# Patient Record
Sex: Female | Born: 1940
Health system: Southern US, Community
[De-identification: ages and names within clinical notes are randomized; demographics above are authoritative.]

---

## 2017-01-26 ENCOUNTER — Observation Stay (HOSPITAL_COMMUNITY)
Admission: EM | Admit: 2017-01-26 | Discharge: 2017-01-27 | Disposition: A | Discharge status: Home / Self Care | Payer: Medicare Other | Attending: Internal Medicine | Admitting: Internal Medicine

## 2017-01-26 ENCOUNTER — Encounter (HOSPITAL_COMMUNITY): Payer: Self-pay | Admitting: *Deleted

## 2017-01-26 ENCOUNTER — Emergency Department (HOSPITAL_COMMUNITY): Payer: Medicare Other

## 2017-01-26 DIAGNOSIS — I2699 Other pulmonary embolism without acute cor pulmonale: Secondary | ICD-10-CM | POA: Diagnosis not present

## 2017-01-26 DIAGNOSIS — Z87891 Personal history of nicotine dependence: Secondary | ICD-10-CM | POA: Diagnosis not present

## 2017-01-26 DIAGNOSIS — R310 Gross hematuria: Secondary | ICD-10-CM | POA: Insufficient documentation

## 2017-01-26 DIAGNOSIS — R319 Hematuria, unspecified: Secondary | ICD-10-CM

## 2017-01-26 DIAGNOSIS — R0602 Shortness of breath: Secondary | ICD-10-CM | POA: Diagnosis not present

## 2017-01-26 LAB — BASIC METABOLIC PANEL
ANION GAP: 13 (ref 5–15)
BUN: 20 mg/dL (ref 6–20)
CHLORIDE: 101 mmol/L (ref 101–111)
CO2: 25 mmol/L (ref 22–32)
Calcium: 9.4 mg/dL (ref 8.9–10.3)
Creatinine, Ser: 1.07 mg/dL — ABNORMAL HIGH (ref 0.44–1.00)
GFR calc non Af Amer: 49 mL/min — ABNORMAL LOW (ref >60)
GFR, EST AFRICAN AMERICAN: 57 mL/min — AB (ref >60)
GLUCOSE: 195 mg/dL — AB (ref 65–99)
Potassium: 4.3 mmol/L (ref 3.5–5.1)
Sodium: 139 mmol/L (ref 135–145)

## 2017-01-26 LAB — CBC
HEMATOCRIT: 43.3 % (ref 36.0–46.0)
HEMOGLOBIN: 14.2 g/dL (ref 12.0–15.0)
MCH: 30.7 pg (ref 26.0–34.0)
MCHC: 32.8 g/dL (ref 30.0–36.0)
MCV: 93.5 fL (ref 78.0–100.0)
Platelets: 212 10*3/uL (ref 150–400)
RBC: 4.63 MIL/uL (ref 3.87–5.11)
RDW: 13.7 % (ref 11.5–15.5)
WBC: 12.4 10*3/uL — ABNORMAL HIGH (ref 4.0–10.5)

## 2017-01-26 LAB — BRAIN NATRIURETIC PEPTIDE: B Natriuretic Peptide: 1159.8 pg/mL — ABNORMAL HIGH (ref 0.0–100.0)

## 2017-01-26 MED ORDER — HEPARIN (PORCINE) IN NACL 100-0.45 UNIT/ML-% IJ SOLN
1400.0000 [IU]/h | INTRAMUSCULAR | Status: DC
  Administered 2017-01-26: 1400 [IU]/h via INTRAVENOUS
  Filled 2017-01-26: qty 250

## 2017-01-26 MED ORDER — PROMETHAZINE HCL 25 MG PO TABS: 12.5000 mg | ORAL_TABLET | Freq: Four times a day (QID) | ORAL | Status: DC | PRN

## 2017-01-26 MED ORDER — SODIUM CHLORIDE 0.9% FLUSH
3.0000 mL | Freq: Two times a day (BID) | INTRAVENOUS | Status: DC
  Administered 2017-01-26: 3 mL via INTRAVENOUS

## 2017-01-26 MED ORDER — ACETAMINOPHEN 650 MG RE SUPP: 650.0000 mg | Freq: Four times a day (QID) | RECTAL | Status: DC | PRN

## 2017-01-26 MED ORDER — RIVAROXABAN 15 MG PO TABS
15.0000 mg | ORAL_TABLET | Freq: Two times a day (BID) | ORAL | Status: DC
  Administered 2017-01-26 – 2017-01-27 (×2): 15 mg via ORAL
  Filled 2017-01-26 (×2): qty 1

## 2017-01-26 MED ORDER — IOPAMIDOL (ISOVUE-370) INJECTION 76%
INTRAVENOUS | Status: AC
  Administered 2017-01-26: 100 mL
  Filled 2017-01-26: qty 100

## 2017-01-26 MED ORDER — SENNOSIDES-DOCUSATE SODIUM 8.6-50 MG PO TABS: 1.0000 | ORAL_TABLET | Freq: Every evening | ORAL | Status: DC | PRN

## 2017-01-26 MED ORDER — HEPARIN BOLUS VIA INFUSION
5000.0000 [IU] | Freq: Once | INTRAVENOUS | Status: AC
  Administered 2017-01-26: 5000 [IU] via INTRAVENOUS
  Filled 2017-01-26: qty 5000

## 2017-01-26 MED ORDER — ACETAMINOPHEN 325 MG PO TABS: 650.0000 mg | ORAL_TABLET | Freq: Four times a day (QID) | ORAL | Status: DC | PRN

## 2017-01-26 NOTE — ED Triage Notes (Signed)
Pt states woke up Sat at 4 am with sob and chest pressure.  States chest pressure/heart burn has gone away,  But still feels very sob with any activity.  Feels like zantac helped.

## 2017-01-26 NOTE — H&P (Signed)
Date: 01/26/2017               Patient Name:  Regina Bell MRN: [id number]  DOB: Mar 02, 1941 Age / Sex: 76 y.o., female   PCP: No Pcp Per Patient         Medical Service: Internal Medicine Teaching Service         Attending Physician: Dr. Geoffery Lyons, MD    First Contact: Dr. Carolynn Comment Pager: [phone]  Second Contact: Dr. Deneise Lever Pager: [phone]       After Hours (After 5p /  First Contact Pager: [phone]  Weekends / Holidays): Second Contact Pager: [phone]   Chief Complaint: SOB  History of Present Illness: Regina Bell is a 76 y.o. female without PMH and no medications who presents with acute onset of SOB.  Pt reports that SOB began 1 wk ago and has been progressively worsening. It is associated with activity and improves with rest. She did have some heart burn last Saturday which began at the same time as her SOB, but this resolved with Zantac and she denies any further CP or pressure. Denies cough, fever, chills, leg swelling, recent travel, recent surgery. Does have remote smoking hx.  In the ED, pt was mildly hypertensive to 160s/80s (but this was measured with wrist BP cuff), tachypneic, but not tachycardic. EKG was wnl. She had CXR suggestive of mild vascular congestion and was found to have elevated BNP to 1159. She had CTA chest for concern of PE and was found to have bilateral PEs w/o R heart strain. She was started on heparin bolus 5000U.  Meds: Current Facility-Administered Medications  Medication Dose Route Frequency Provider Last Rate Last Dose  . heparin ADULT infusion 100 units/mL (25000 units/241mL sodium chloride 0.45%)  1,400 Units/hr Intravenous Continuous Sherron Monday, RPH 14 mL/hr at 01/26/17 1517 1,400 Units/hr at 01/26/17 1517   No current outpatient prescriptions on file.   Allergies: Allergies as of 01/26/2017  . (No Known Allergies)   History reviewed. No pertinent past medical history. Family History: Pt family history is  not on file.  Social History: Pt  reports that she has quit smoking. She has never used smokeless tobacco. She reports that she does not drink alcohol or use drugs.  Review of Systems: A complete ROS was negative except as per HPI. Review of Systems  Constitutional: Negative for chills, fever and weight loss.  Eyes: Negative for blurred vision.  Respiratory: Positive for shortness of breath. Negative for cough.   Cardiovascular: Negative for chest pain and leg swelling.  Gastrointestinal: Negative for abdominal pain, constipation, diarrhea, nausea and vomiting.  Genitourinary: Negative for dysuria, frequency and urgency.  Musculoskeletal: Negative for myalgias.  Skin: Negative for rash.  Neurological: Negative for dizziness, tremors and headaches.  Endo/Heme/Allergies: Negative for polydipsia.  Psychiatric/Behavioral: The patient is not nervous/anxious.    Physical Exam: Vitals:   01/26/17 1145 01/26/17 1430 01/26/17 1500 01/26/17 1515  BP: (!) 166/78 (!) 160/89 (!) 149/51 (!) 150/72  Pulse: 87 92 85 83  Resp: (!) 24 (!) 28 (!) 27 (!) 26  Temp:      TempSrc:      SpO2: 94% 92% 96% 97%  Weight:      Height:       Physical Exam  Constitutional: She is oriented to person, place, and time. She appears well-developed. She is cooperative. No distress.  HENT:  Head: Normocephalic and atraumatic.  Right Ear: Hearing normal.  Left Ear:  Hearing normal.  Nose: Nose normal.  Mouth/Throat: Mucous membranes are normal.  Cardiovascular: Normal rate, regular rhythm, S1 normal, S2 normal and intact distal pulses.  Exam reveals no gallop.   No murmur heard. Pulmonary/Chest: Effort normal. No respiratory distress. She has no wheezes. She has no rhonchi. She has rales (few bibasilar crackles). She exhibits no tenderness. Breasts are symmetrical.  Abdominal: Soft. Normal appearance and bowel sounds are normal. She exhibits no ascites. There is no hepatosplenomegaly. There is no tenderness.  There is no CVA tenderness.  Musculoskeletal: Normal range of motion. She exhibits no edema.  Neurological: She is alert and oriented to person, place, and time. She has normal strength.  Skin: Skin is warm, dry and intact. She is not diaphoretic.  Psychiatric: She has a normal mood and affect. Her speech is normal and behavior is normal.   Labs: CBC:  Recent Labs Lab 01/26/17 1016  WBC 12.4*  HGB 14.2  HCT 43.3  MCV 93.5  PLT 212   Basic Metabolic Panel:  Recent Labs Lab 01/26/17 1016  NA 139  K 4.3  CL 101  CO2 25  GLUCOSE 195*  BUN 20  CREATININE 1.07*  CALCIUM 9.4   BNP (last 3 results)  Recent Labs  01/26/17 1229  BNP 1,159.8*   Imaging: EKG Interpretation   Dg Chest 2 View Result Date: 01/26/2017 CLINICAL DATA:  Severe shortness of Breath EXAM: CHEST  2 VIEW COMPARISON:  None. FINDINGS: Cardiac shadow is enlarged. Aortic calcifications are noted. Mild vascular congestion is seen without interstitial edema. Mild bibasilar atelectatic changes are noted. Degenerative change of the thoracic spine is seen. IMPRESSION: Mild vascular congestion without interstitial edema. Electronically Signed   By: Alcide Clever M.D.   On: 01/26/2017 10:38   Ct Angio Chest Pe W Or Wo Contrast Result Date: 01/26/2017 CLINICAL DATA:  Shortness of breath for 1 week EXAM: CT ANGIOGRAPHY CHEST WITH CONTRAST TECHNIQUE: Multidetector CT imaging of the chest was performed using the standard protocol during bolus administration of intravenous contrast. Multiplanar CT image reconstructions and MIPs were obtained to evaluate the vascular anatomy. CONTRAST:  100 mL Isovue 370. COMPARISON:  None. FINDINGS: Cardiovascular: Thoracic aorta shows a normal branching pattern. Diffuse atherosclerotic calcifications are noted without aneurysmal dilatation. The pulmonary artery is well visualized and demonstrates scattered filling defects throughout both lungs consistent with emboli. No evidence of right  heart strain is noted at this time. Mediastinum/Nodes: No hilar or mediastinal adenopathy is noted. The thoracic inlet is within normal limits. Lungs/Pleura: Bilateral large pleural effusions are noted. Mild bibasilar atelectatic changes are seen. Upper Abdomen: No acute abnormality noted. Musculoskeletal: Degenerative changes of the thoracic spine are seen. Review of the MIP images confirms the above findings. IMPRESSION: Bilateral pulmonary emboli without right heart strain No other focal abnormality is noted. Critical Value/emergent results were called by telephone at the time of interpretation on 01/26/2017 at 1:17 pm to Dr. Geoffery Lyons , who verbally acknowledged these results. Electronically Signed   By: Alcide Clever M.D.   On: 01/26/2017 13:18   Assessment & Plan by Problem: Active Problems:   Pulmonary embolism [facility])  Regina Bell is a 77 y.o. female w/o PMH who presents with SOB 2/2 bilateral PEs.  1) PE: HDS w/o evidence of R heart strain radiographically. s/p heparin bolus. Will transition to Xarelto. - admit to tele for obs - start Xarelto BID - repeat CXR in AM  2) Elevated BP: pt presents with elevated BP in  the setting of acute PE. No h/o HTN. Will watch presently.  DVT PPx - Xarelto  Code Status - Full  Dispo: Admit patient to Observation with expected length of stay less than 2 midnights.  Signed: Carolynn Comment, MD 01/26/2017, 3:41 PM  Pager: [phone]

## 2017-01-26 NOTE — ED Notes (Signed)
Patient transported to CT 

## 2017-01-26 NOTE — Progress Notes (Addendum)
ANTICOAGULATION CONSULT NOTE - Initial Consult  Addendum: Transition to Xarelto  BID for first 21 days, then  daily after. Stop heparin at time of first Xarelto dose.   Pharmacy Consult for heparin Indication: pulmonary embolus  No Known Allergies  Patient Measurements: Height:  (165.1 cm) Weight: 229 lb (103.9 kg) IBW/kg (Calculated) : 57 Heparin Dosing Weight: 81 kg  Vital Signs: Temp: 97.9 F (36.6 C) (04/20 1009) Temp Source: Oral (04/20 1009) BP: 160/89 (04/20 1430) Pulse Rate: 92 (04/20 1430)  Labs:  Recent Labs  01/26/17 1016  HGB 14.2  HCT 43.3  PLT 212  CREATININE 1.07*    Estimated Creatinine Clearance: 54.4 mL/min (A) (by C-G formula based on SCr of 1.07 mg/dL (H)).   Medical History: History reviewed. No pertinent past medical history.  Assessment: 76 yo female with bilateral PE w/o right heart strain. Pharmacy consulted to start heparin. CBC wnl.  Goal of Therapy:  Heparin level 0.3-0.7 units/ml Monitor platelets by anticoagulation protocol: Yes   Plan:  Give 5000 units bolus x 1 Start heparin infusion at 1400 units/hr Check anti-Xa level in 8 hours and daily while on heparin Continue to monitor H&H and platelets  Sherron Monday 01/26/2017,3:25 PM

## 2017-01-26 NOTE — Discharge Instructions (Signed)
Information on my medicine - XARELTO (rivaroxaban)  This medication education was reviewed with me or my healthcare representative as part of my discharge preparation.  The pharmacist that spoke with me during my hospital stay was:  Sherron Monday, [facility]  WHY WAS XARELTO PRESCRIBED FOR YOU? Xarelto was prescribed to treat blood clots that may have been found in the veins of your legs (deep vein thrombosis) or in your lungs (pulmonary embolism) and to reduce the risk of them occurring again.  What do you need to know about Xarelto? The starting dose is one 15 mg tablet taken TWICE daily with food for the FIRST 21 DAYS then on 02/16/17  the dose is changed to one 20 mg tablet taken ONCE A DAY with your evening meal.  DO NOT stop taking Xarelto without talking to the health care provider who prescribed the medication.  Refill your prescription for 20 mg tablets before you run out.  After discharge, you should have regular check-up appointments with your healthcare provider that is prescribing your Xarelto.  In the future your dose may need to be changed if your kidney function changes by a significant amount.  What do you do if you miss a dose? If you are taking Xarelto TWICE DAILY and you miss a dose, take it as soon as you remember. You may take two 15 mg tablets (total 30 mg) at the same time then resume your regularly scheduled 15 mg twice daily the next day.  If you are taking Xarelto ONCE DAILY and you miss a dose, take it as soon as you remember on the same day then continue your regularly scheduled once daily regimen the next day. Do not take two doses of Xarelto at the same time.   Important Safety Information Xarelto is a blood thinner medicine that can cause bleeding. You should call your healthcare provider right away if you experience any of the following: ? Bleeding from an injury or your nose that does not stop. ? Unusual colored urine (red or dark brown) or unusual colored  stools (red or black). ? Unusual bruising for unknown reasons. ? A serious fall or if you hit your head (even if there is no bleeding).  Some medicines may interact with Xarelto and might increase your risk of bleeding while on Xarelto. To help avoid this, consult your healthcare provider or pharmacist prior to using any new prescription or non-prescription medications, including herbals, vitamins, non-steroidal anti-inflammatory drugs (NSAIDs) and supplements.  This website has more information on Xarelto: VisitDestination.com.br.

## 2017-01-26 NOTE — ED Provider Notes (Signed)
[facility]-EMERGENCY DEPT Provider Note   CSN: [id number] Arrival date & time: 01/26/17  4098     History   Chief Complaint Chief Complaint  Patient presents with  . Shortness of Breath    HPI Regina Bell is a 76 y.o. female.  Patient is a 76 year old female with no prior past medical history who takes no medications. She presents today for evaluation of shortness of breath. This has been present for one week and is worsening. She has no symptoms at rest, however when she stands and ambulates becomes dyspneic. She denies any chest pain, fevers, chills, or productive cough. She denies any leg swelling. She denies any recent travel.   The history is provided by the patient.  Shortness of Breath  This is a new problem. The average episode lasts 1 week. The problem occurs continuously.The problem has been gradually worsening. Pertinent negatives include no fever, no cough, no sputum production, no chest pain, no leg pain and no leg swelling. She has tried nothing for the symptoms.    History reviewed. No pertinent past medical history.  There are no active problems to display for this patient.   History reviewed. No pertinent surgical history.  OB History    No data available       Home Medications    Prior to Admission medications   Not on File    Family History No family history on file.  Social History Social History  Substance Use Topics  . Smoking status: Former Games developer  . Smokeless tobacco: Never Used  . Alcohol use No     Allergies   Patient has no known allergies.   Review of Systems Review of Systems  Constitutional: Negative for fever.  Respiratory: Positive for shortness of breath. Negative for cough and sputum production.   Cardiovascular: Negative for chest pain and leg swelling.  All other systems reviewed and are negative.    Physical Exam Updated Vital Signs BP (!) 177/89   Pulse 91   Temp 97.9 F (36.6 C) (Oral)   Resp (!) 30    Ht  (1.651 m)   Wt 229 lb (103.9 kg)   SpO2 94%   BMI 38.11 kg/m   Physical Exam  Constitutional: She is oriented to person, place, and time. She appears well-developed and well-nourished. No distress.  HENT:  Head: Normocephalic and atraumatic.  Mouth/Throat: Oropharynx is clear and moist.  Neck: Normal range of motion. Neck supple.  Cardiovascular: Normal rate and regular rhythm.  Exam reveals no gallop and no friction rub.   No murmur heard. Pulmonary/Chest: Effort normal and breath sounds normal. No respiratory distress. She has no wheezes.  Abdominal: Soft. Bowel sounds are normal. She exhibits no distension. There is no tenderness.  Musculoskeletal: Normal range of motion. She exhibits no edema.  Neurological: She is alert and oriented to person, place, and time.  Skin: Skin is warm and dry. She is not diaphoretic.  Nursing note and vitals reviewed.    ED Treatments / Results  Labs (all labs ordered are listed, but only abnormal results are displayed) Labs Reviewed  BASIC METABOLIC PANEL - Abnormal; Notable for the following:       Result Value   Glucose, Bld 195 (*)    Creatinine, Ser 1.07 (*)    GFR calc non Af Amer 49 (*)    GFR calc Af Amer 57 (*)    All other components within normal limits  CBC - Abnormal; Notable for the following:  WBC 12.4 (*)    All other components within normal limits  BRAIN NATRIURETIC PEPTIDE  I-STAT TROPOININ, ED    EKG  EKG Interpretation None       Radiology Dg Chest 2 View  Result Date: 01/26/2017 CLINICAL DATA:  Severe shortness of Breath EXAM: CHEST  2 VIEW COMPARISON:  None. FINDINGS: Cardiac shadow is enlarged. Aortic calcifications are noted. Mild vascular congestion is seen without interstitial edema. Mild bibasilar atelectatic changes are noted. Degenerative change of the thoracic spine is seen. IMPRESSION: Mild vascular congestion without interstitial edema. Electronically Signed   By: Alcide Clever M.D.   On:  01/26/2017 10:38    Procedures Procedures (including critical care time)  Medications Ordered in ED Medications - No data to display   Initial Impression / Assessment and Plan / ED Course  I have reviewed the triage vital signs and the nursing notes.  Pertinent labs & imaging results that were available during my care of the patient were reviewed by me and considered in my medical decision making (see chart for details).  CT scan shows bilateral pulmonary emboli with no evidence for right heart strain. She will be admitted to the teaching service. Heparin has been initiated in the ED.  CRITICAL CARE Performed by: Geoffery Lyons Total critical care time: 30 minutes Critical care time was exclusive of separately billable procedures and treating other patients. Critical care was necessary to treat or prevent imminent or life-threatening deterioration. Critical care was time spent personally by me on the following activities: development of treatment plan with patient and/or surrogate as well as nursing, discussions with consultants, evaluation of patient's response to treatment, examination of patient, obtaining history from patient or surrogate, ordering and performing treatments and interventions, ordering and review of laboratory studies, ordering and review of radiographic studies, pulse oximetry and re-evaluation of patient's condition.   Final Clinical Impressions(s) / ED Diagnoses   Final diagnoses:  None    New Prescriptions New Prescriptions   No medications on file     Geoffery Lyons, MD 01/27/17 1428

## 2017-01-26 NOTE — ED Notes (Signed)
Attempted to call report

## 2017-01-26 NOTE — Progress Notes (Signed)
Patient arrived on the unit from the ED, assessment completed see flowsheet , patient placed on tele CCMD notified, patient oriented to room and staff, bed in lowest position, call bell within reach will continue to monitor.

## 2017-01-27 DIAGNOSIS — I2699 Other pulmonary embolism without acute cor pulmonale: Secondary | ICD-10-CM | POA: Diagnosis not present

## 2017-01-27 DIAGNOSIS — R319 Hematuria, unspecified: Secondary | ICD-10-CM | POA: Diagnosis not present

## 2017-01-27 LAB — BASIC METABOLIC PANEL
Anion gap: 8 (ref 5–15)
BUN: 19 mg/dL (ref 6–20)
CALCIUM: 9 mg/dL (ref 8.9–10.3)
CO2: 26 mmol/L (ref 22–32)
CREATININE: 0.92 mg/dL (ref 0.44–1.00)
Chloride: 105 mmol/L (ref 101–111)
GFR calc Af Amer: >60 mL/min (ref >60)
GFR calc non Af Amer: 59 mL/min — ABNORMAL LOW (ref >60)
Glucose, Bld: 139 mg/dL — ABNORMAL HIGH (ref 65–99)
Potassium: 3.9 mmol/L (ref 3.5–5.1)
Sodium: 139 mmol/L (ref 135–145)

## 2017-01-27 LAB — CBC
HEMATOCRIT: 40.6 % (ref 36.0–46.0)
Hemoglobin: 12.9 g/dL (ref 12.0–15.0)
MCH: 30 pg (ref 26.0–34.0)
MCHC: 31.8 g/dL (ref 30.0–36.0)
MCV: 94.4 fL (ref 78.0–100.0)
PLATELETS: 194 10*3/uL (ref 150–400)
RBC: 4.3 MIL/uL (ref 3.87–5.11)
RDW: 13.5 % (ref 11.5–15.5)
WBC: 11.1 10*3/uL — ABNORMAL HIGH (ref 4.0–10.5)

## 2017-01-27 MED ORDER — RIVAROXABAN 15 MG PO TABS: 15.0000 mg | ORAL_TABLET | Freq: Two times a day (BID) | ORAL | 0 refills | Status: DC

## 2017-01-27 NOTE — Progress Notes (Signed)
  Date: 01/27/2017  Patient name: Regina Bell  Medical record number: [id number]  Date of birth: 06/03/1941   I have seen and evaluated Eloise Harman and discussed their care with the Residency Team. Ms Lavalle is a 76 year old woman with no past medical history. She presented with a chief complaint of dyspnea for one week which was worsening. She was found to have bilateral PEs without right heart strain. She was started on a heparin drip and transitioned over to a DOAC.  This morning, her dyspnea is slightly better but not resolved. She complains of gross hematuria which she had never had before. She is able to ambulate in the room without dyspnea or oxygen.  PMHx, Fam Hx, and/or Soc Hx : She has no significant past medical history. She does not smoke and is married and lives at her home. Her husband has been able to get her an appointment with his PCP.  Vitals:   01/26/17 2004 01/27/17 0436  BP: (!) 134/95 (!) 127/92  Pulse:  89  Resp:  16  Temp:  97.9 F (36.6 C)  T max 97.9 RR 16 BP 127/92 O2 sat 95%  Gen. No acute distress but appears slightly dyspneic Heart regular rate and rhythm no murmurs Lungs clear to auscultation bilaterally Extremities no edema and no calf pain  Assessment and Plan: I have seen and evaluated the patient as outlined above. I agree with the formulated Assessment and Plan as detailed in the residents' note, with the following changes:   1.  Acute unprovoked bilateral pulmonary embolus -  The patient is hemodynamically stable and is able to go home. She is being discharged on Rivaroxaban..  The 70 to be continued for a minimum of 3 months at which time the risk benefits of additional therapy will need to be considered.  2. Macroscopic hematuria -  She does not have a history of hematuria. This only started after being on heparin drip. She will need a repeat urinalysis as an outpatient and if there are still RBCs, she will need a urologic workup.   For  discharge to home.  Burns Spain, MD 4/21/20183:51 PM

## 2017-01-27 NOTE — Discharge Summary (Signed)
Name: Regina Bell MRN: [id number] DOB: April 24, 1941 76 y.o. PCP: No Pcp Per Patient  Date of Admission: 01/26/2017 11:21 AM Date of Discharge: 01/27/2017 Attending Physician: Burns Spain, MD  Discharge Diagnosis: Active Problems:   Pulmonary embolism [facility])   Hematuria  Discharge Medications: Allergies as of 01/27/2017   No Known Allergies     Medication List    TAKE these medications   ECHINACEA PO Take 1 capsule by mouth daily.   ibuprofen 200 MG tablet Commonly known as:  ADVIL,MOTRIN Take 200-400 mg by mouth every 6 (six) hours as needed (for pain).   OCUVITE PO Take 1 tablet by mouth daily.   Rivaroxaban 15 MG Tabs tablet Commonly known as:  XARELTO Take 1 tablet (15 mg total) by mouth 2 (two) times daily with a meal.   VITAMIN C PO Take 1 tablet by mouth daily.       Disposition and follow-up:   [name] was discharged from [facility] in Good condition.  At the hospital follow up visit please address:  1.  PE: Assess symptoms of SOB and compliance w/ Xarelto. Hematuria: assess for persistence and initiate further w/u if not resolved.  2.  Labs / imaging needed at time of follow-up: UA  Follow-up Appointments: Follow-up Information    Your PCP. Schedule an appointment as soon as possible for a visit in 1 week(s).   Why:  Make an appointment to follow up on your anticoagulation and establish care.          Hospital Course by problem list: Active Problems:   Pulmonary embolism (HCC)   Hematuria   1. Unprovoked pulmonary embolism: 2. Hematuria: Patient presented with one-week history of shortness of breath and significant dyspnea on exertion. She initially describes some chest pressure which was relieved with Zantac and did not have recurrence of this over the last several days. On arrival to the emergency department she had no significant oxygen requirement at rest and resume likely stable without  tachycardia. With breath rate in the 20s, was mildly hypertensive, and was found to have bilateral pulmonary embolus without radiographic evidence of right heart strain. She was started on heparin and transitioned immediately to Xarelto. There were no provoking factors noted in her history. Over the night patient developed some gross hematuria which appeared to be resolving by the next morning. Patient was counseled that should this hematuria persist or worsen she should have further evaluation immediately. Patient should have repeat urinalysis at her follow-up, we feel it is likely this is likely related to anticoagulation. However should this persist she should have further workup.  Discharge Vitals:   BP (!) 127/92 (BP Location: Right Arm)   Pulse 89   Temp 97.9 F (36.6 C) (Oral)   Resp 16   Ht  (1.651 m)   Wt 228 lb (103.4 kg)   SpO2 95%   BMI 37.94 kg/m   Pertinent Labs, Studies, and Procedures:  Recent Labs Lab 01/26/17 1016 01/27/17 0259  HGB 14.2 12.9  HCT 43.3 40.6  WBC 12.4* 11.1*  PLT 212 194   Procedures Performed:  Dg Chest 2 View Result Date: 01/26/2017 IMPRESSION: Mild vascular congestion without interstitial edema.   Ct Angio Chest Pe W Or Wo Contrast Result Date: 01/26/2017 IMPRESSION: Bilateral pulmonary emboli without right heart strain No other focal abnormality is noted.  Discharge Instructions: Discharge Instructions    Call MD for:    Complete by:  As directed  Worsening of bloody urine or blood in your stool.   Call MD for:  difficulty breathing, headache or visual disturbances    Complete by:  As directed    Call MD for:  extreme fatigue    Complete by:  As directed    Call MD for:  persistant dizziness or light-headedness    Complete by:  As directed    Diet - low sodium heart healthy    Complete by:  As directed    Discharge instructions    Complete by:  As directed    You had blood clots in your lungs. We have started you on a blood  thinner medication called Xarelto. You will take  twice daily for 21 days, then  daily after that. You should follow up with your Primary doctor after leaving the hospital next week.  The blood in your urine is likely related to the blood thinner medication, but you should ask you primary doctor about this and have your urine checked as we may need to preform some further testing. If you experience any shortness of breath, chest pain, fatigue, or notice blood in your stool or dark black stools - please let your doctor know immediately.   Increase activity slowly    Complete by:  As directed      Signed: Carolynn Comment, MD 01/27/2017, 11:31 AM   Pager: [phone]

## 2017-01-27 NOTE — Progress Notes (Signed)
   Subjective: Currently, the patient is feeling improved. She has improved SOB, no CP. Able to ambulate to bathroom w/o O2.  c/o new onset hematuria since starting [facility]. 4 episodes ON, seems to be diminishing. Counseled on S/S of anemia and should have f/u for resolution vs further w/u.  Objective: Vital signs in last 24 hours: Vitals:   01/26/17 1957 01/26/17 2004 01/27/17 0436 01/27/17 0900  BP: (!) 176/69 (!) 134/95 (!) 127/92   Pulse: 91  89   Resp: 18  16   Temp: 98.3 F (36.8 C)  97.9 F (36.6 C)   TempSrc: Oral  Oral   SpO2: 99%  99% 95%  Weight:      Height:       Physical Exam: Physical Exam  Constitutional: She appears well-developed. She is cooperative. No distress.  Cardiovascular: Normal rate, regular rhythm, normal heart sounds and normal pulses.  Exam reveals no gallop.   No murmur heard. Pulmonary/Chest: Effort normal and breath sounds normal. No respiratory distress. Breasts are symmetrical.  Abdominal: Soft. Bowel sounds are normal. There is no tenderness.  Musculoskeletal: She exhibits edema (trace).   Labs: CBC:  Recent Labs Lab 01/26/17 1016 01/27/17 0259  WBC 12.4* 11.1*  HGB 14.2 12.9  HCT 43.3 40.6  MCV 93.5 94.4  PLT 212 194   Metabolic Panel:  Recent Labs Lab 01/26/17 1016 01/27/17 0259  NA 139 139  K 4.3 3.9  CL 101 105  CO2 25 26  GLUCOSE 195* 139*  BUN 20 19  CREATININE 1.07* 0.92  CALCIUM 9.4 9.0   Cardiac Labs:  Recent Labs Lab 01/26/17 1229  BNP 1,159.8*    Medications: Scheduled Medications: . Rivaroxaban  15 mg Oral BID WC  . sodium chloride flush  3 mL Intravenous Q12H   PRN Medications: acetaminophen **OR** acetaminophen, promethazine, senna-docusate  Assessment/Plan: Regina Bell is a 76 y.o. female w/o PMH who presents with SOB 2/2 bilateral PEs.  1) Unprovoked PE: SOB improved. On Xarelto. Had some hematuria likely 2/2 AC, should have f/u with PCP. - ambulatory pulse-ox to assess supplemental  O2 requirement - continue Xarelto  BID x 21d, then  qD at least 3 months, likely indefinitely  2) Hematuria: 4 episodes of gross hematuria in setting of AC. Remote smoking hx. Should have f/u for resolution or further w/u w/ Urology is not resolved. - outpt f/u  3) Elevated ZO:XWRUEAVW today.  Length of Stay: 0 day(s) Dispo: Anticipated discharge today  Regina Comment, MD Pager: [phone] (7AM-5PM) 01/27/2017, 11:12 AM

## 2017-02-06 ENCOUNTER — Other Ambulatory Visit: Payer: Self-pay | Admitting: Internal Medicine

## 2017-02-06 DIAGNOSIS — I2699 Other pulmonary embolism without acute cor pulmonale: Secondary | ICD-10-CM | POA: Diagnosis not present

## 2017-02-06 DIAGNOSIS — I10 Essential (primary) hypertension: Secondary | ICD-10-CM | POA: Diagnosis not present

## 2017-02-06 DIAGNOSIS — Z79899 Other long term (current) drug therapy: Secondary | ICD-10-CM | POA: Diagnosis not present

## 2017-02-06 DIAGNOSIS — R310 Gross hematuria: Secondary | ICD-10-CM

## 2017-02-06 DIAGNOSIS — E559 Vitamin D deficiency, unspecified: Secondary | ICD-10-CM | POA: Diagnosis not present

## 2017-02-07 ENCOUNTER — Ambulatory Visit
Admission: RE | Admit: 2017-02-07 | Discharge: 2017-02-07 | Disposition: A | Discharge status: Home / Self Care | Payer: Medicare Other | Source: Ambulatory Visit | Attending: Internal Medicine | Admitting: Internal Medicine

## 2017-02-07 DIAGNOSIS — N200 Calculus of kidney: Secondary | ICD-10-CM | POA: Diagnosis not present

## 2017-02-07 DIAGNOSIS — R310 Gross hematuria: Secondary | ICD-10-CM

## 2017-02-12 DIAGNOSIS — R1111 Vomiting without nausea: Secondary | ICD-10-CM | POA: Diagnosis not present

## 2017-02-12 DIAGNOSIS — N1339 Other hydronephrosis: Secondary | ICD-10-CM | POA: Diagnosis not present

## 2017-02-12 DIAGNOSIS — N202 Calculus of kidney with calculus of ureter: Secondary | ICD-10-CM | POA: Diagnosis not present

## 2017-03-12 DIAGNOSIS — I10 Essential (primary) hypertension: Secondary | ICD-10-CM | POA: Diagnosis not present

## 2017-03-12 DIAGNOSIS — H6121 Impacted cerumen, right ear: Secondary | ICD-10-CM | POA: Diagnosis not present

## 2017-03-12 DIAGNOSIS — Z79899 Other long term (current) drug therapy: Secondary | ICD-10-CM | POA: Diagnosis not present

## 2017-03-12 DIAGNOSIS — R310 Gross hematuria: Secondary | ICD-10-CM | POA: Diagnosis not present

## 2017-03-12 DIAGNOSIS — I2699 Other pulmonary embolism without acute cor pulmonale: Secondary | ICD-10-CM | POA: Diagnosis not present

## 2017-03-26 DIAGNOSIS — N200 Calculus of kidney: Secondary | ICD-10-CM | POA: Diagnosis not present

## 2017-06-14 DIAGNOSIS — E559 Vitamin D deficiency, unspecified: Secondary | ICD-10-CM | POA: Diagnosis not present

## 2017-06-14 DIAGNOSIS — Z79899 Other long term (current) drug therapy: Secondary | ICD-10-CM | POA: Diagnosis not present

## 2017-06-14 DIAGNOSIS — I2699 Other pulmonary embolism without acute cor pulmonale: Secondary | ICD-10-CM | POA: Diagnosis not present

## 2017-06-14 DIAGNOSIS — R310 Gross hematuria: Secondary | ICD-10-CM | POA: Diagnosis not present

## 2017-06-14 DIAGNOSIS — I10 Essential (primary) hypertension: Secondary | ICD-10-CM | POA: Diagnosis not present

## 2017-06-14 DIAGNOSIS — Z23 Encounter for immunization: Secondary | ICD-10-CM | POA: Diagnosis not present

## 2017-06-27 DIAGNOSIS — R8299 Other abnormal findings in urine: Secondary | ICD-10-CM | POA: Diagnosis not present

## 2017-06-27 DIAGNOSIS — R8290 Unspecified abnormal findings in urine: Secondary | ICD-10-CM | POA: Diagnosis not present

## 2017-07-02 DIAGNOSIS — N200 Calculus of kidney: Secondary | ICD-10-CM | POA: Diagnosis not present

## 2017-07-02 DIAGNOSIS — R3121 Asymptomatic microscopic hematuria: Secondary | ICD-10-CM | POA: Diagnosis not present

## 2017-09-19 DIAGNOSIS — I2699 Other pulmonary embolism without acute cor pulmonale: Secondary | ICD-10-CM | POA: Diagnosis not present

## 2017-09-19 DIAGNOSIS — I10 Essential (primary) hypertension: Secondary | ICD-10-CM | POA: Diagnosis not present

## 2017-09-19 DIAGNOSIS — Z79899 Other long term (current) drug therapy: Secondary | ICD-10-CM | POA: Diagnosis not present

## 2017-09-19 DIAGNOSIS — R310 Gross hematuria: Secondary | ICD-10-CM | POA: Diagnosis not present

## 2018-08-22 IMAGING — CT CT ANGIO CHEST
2 of 8 series · 18 of 46 positions shown · IV contrast (APPLIED)
Comparison: None.

CLINICAL DATA: Shortness of breath for 1 week

EXAM:
CT ANGIOGRAPHY CHEST WITH CONTRAST
TECHNIQUE: Multidetector CT imaging of the chest was performed using the
standard protocol during bolus administration of intravenous
contrast. Multiplanar CT image reconstructions and MIPs were
obtained to evaluate the vascular anatomy.
CONTRAST:  100 mL Isovue 370.

[Series 6: thins · axial · 0.88mm/px · z∈[-291,-10]mm · 15 of 311 slices shown]
[im 15/311  lung]
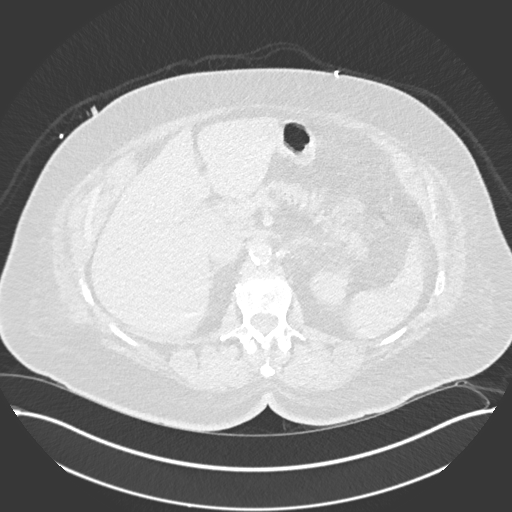
[im 43/311  soft-tissue]
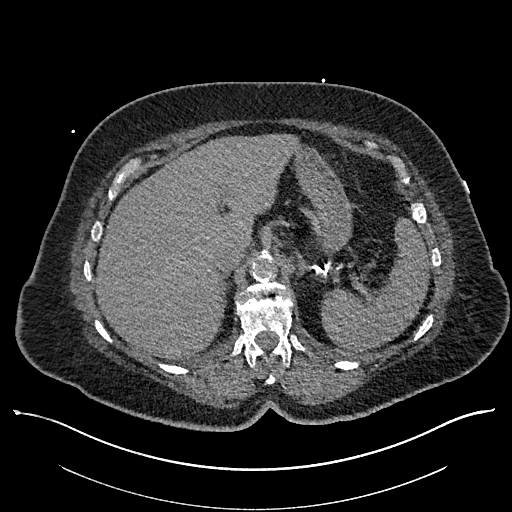
[im 57/311  lung]
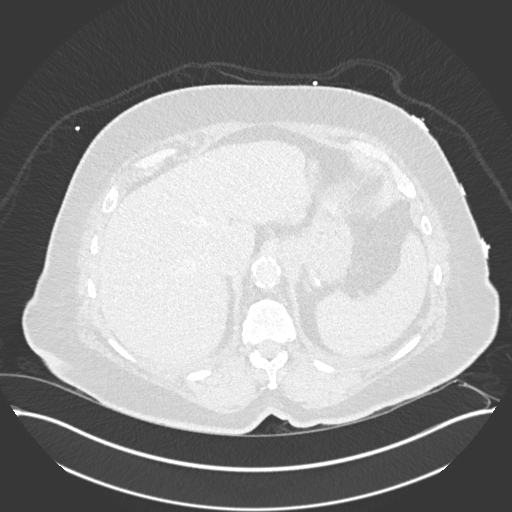
[im 71/311  soft-tissue]
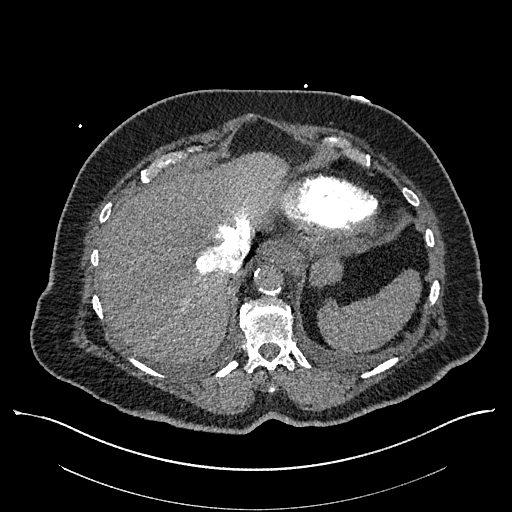
[im 99/311  lung]
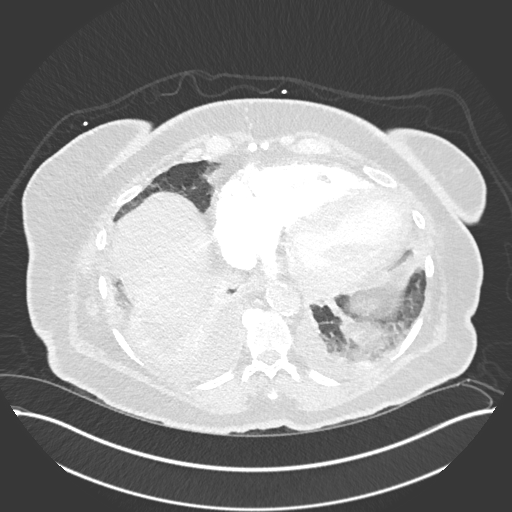
[im 113/311  soft-tissue]
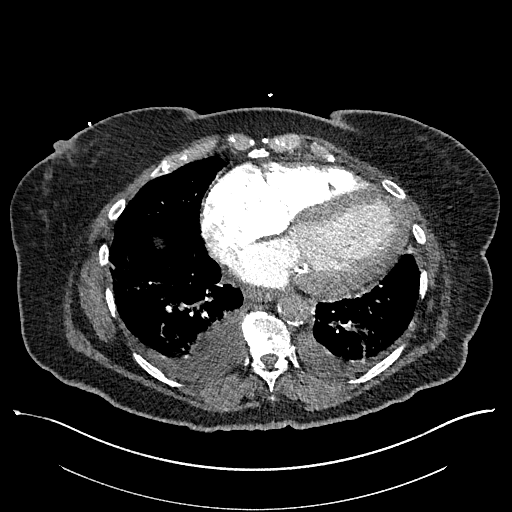
[im 141/311  lung]
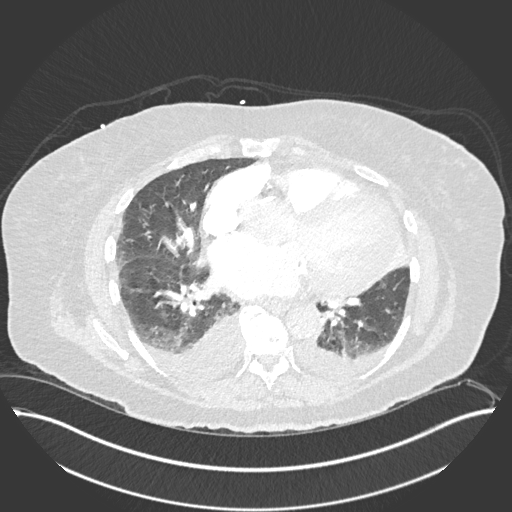
[im 156/311  soft-tissue]
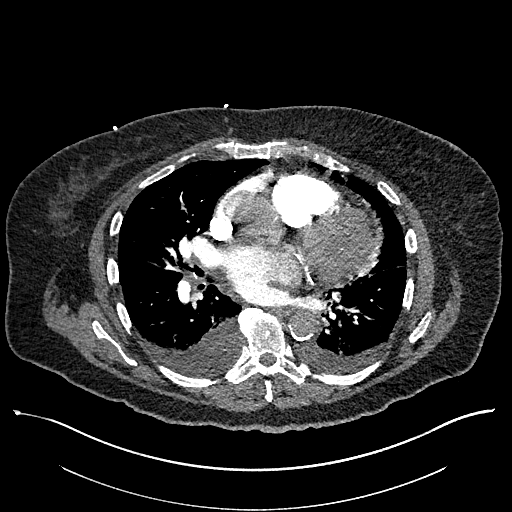
[im 170/311  lung]
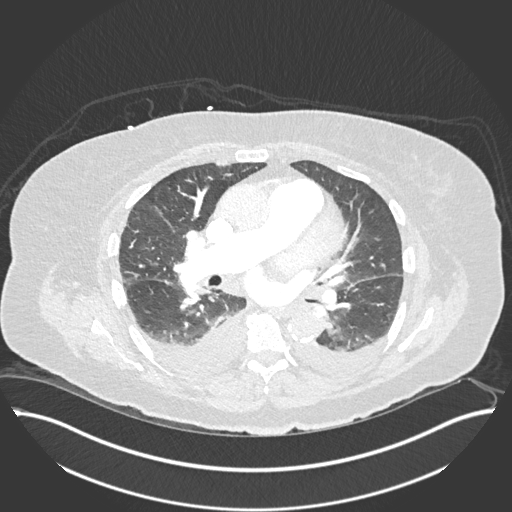
[im 198/311  soft-tissue]
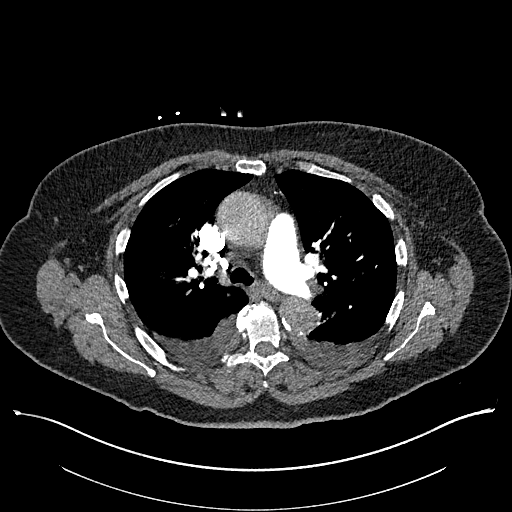
[im 212/311  lung]
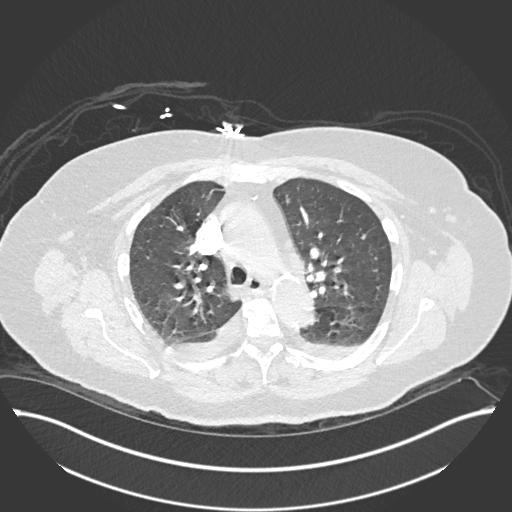
[im 240/311  soft-tissue]
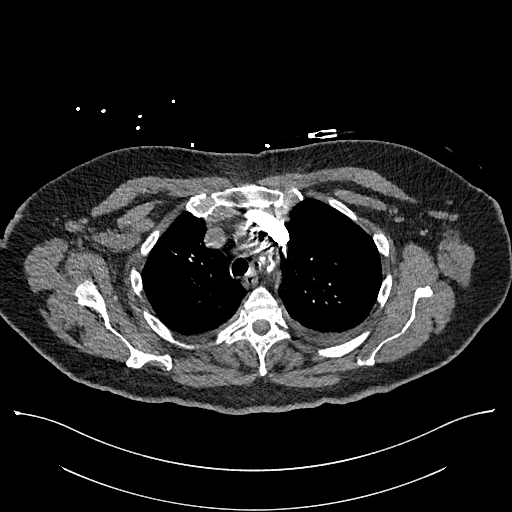
[im 254/311  lung]
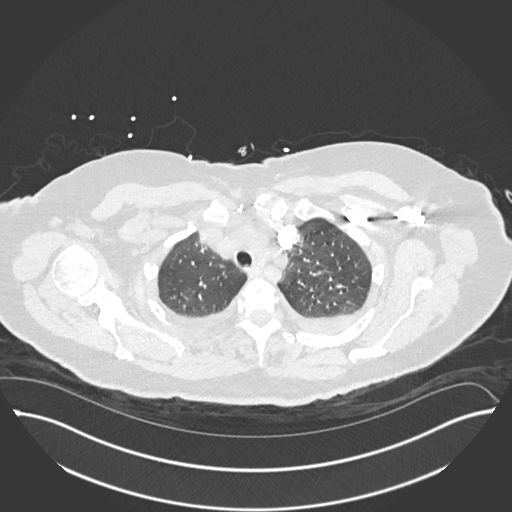
[im 268/311  soft-tissue]
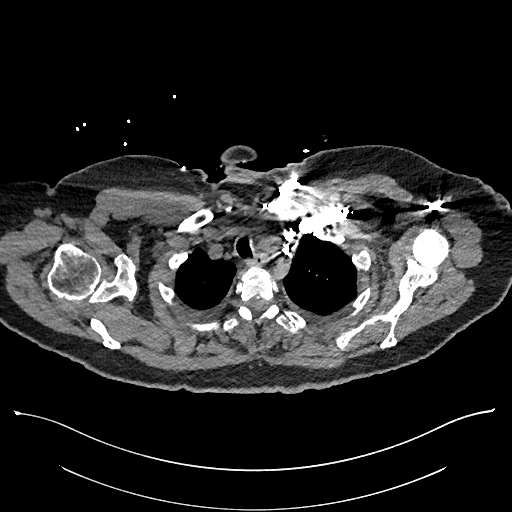
[im 296/311  lung]
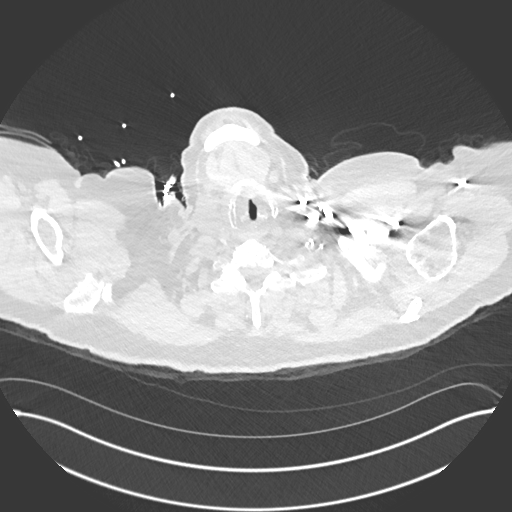

[Series 8: coronal mpr · coronal · 0.61mm/px · 3 of 151 slices shown]
[im 38/151  soft-tissue]
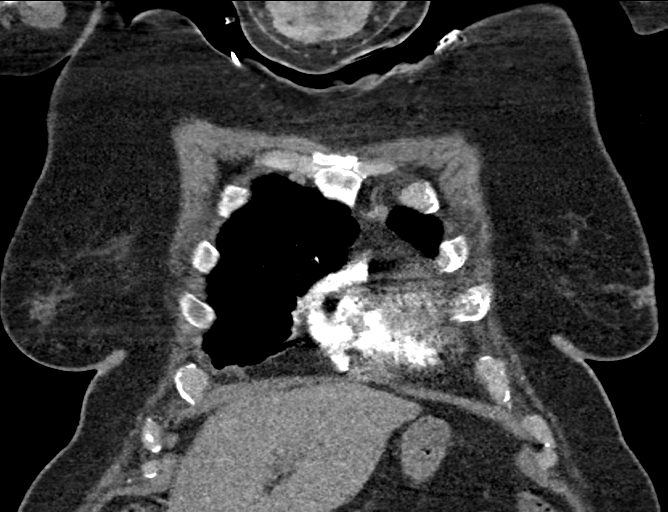
[im 76/151  soft-tissue]
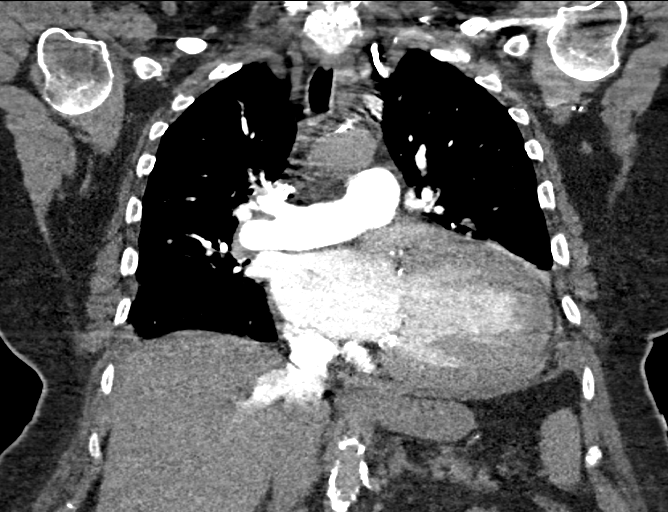
[im 113/151  soft-tissue]
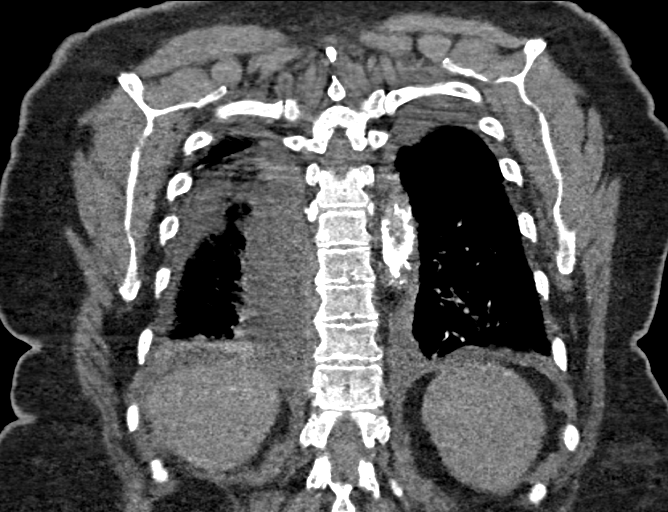

[18 of 46 positions shown; findings below may reference images not displayed]

FINDINGS: Cardiovascular: Thoracic aorta shows a normal branching pattern.
Diffuse atherosclerotic calcifications are noted without aneurysmal
dilatation. The pulmonary artery is well visualized and demonstrates
scattered filling defects throughout both lungs consistent with
emboli. No evidence of right heart strain is noted at this time.

Mediastinum/Nodes: No hilar or mediastinal adenopathy is noted. The
thoracic inlet is within normal limits.

Lungs/Pleura: Bilateral large pleural effusions are noted. Mild
bibasilar atelectatic changes are seen.

Upper Abdomen: No acute abnormality noted.

Musculoskeletal: Degenerative changes of the thoracic spine are
seen.

Review of the MIP images confirms the above findings.
IMPRESSION: Bilateral pulmonary emboli without right heart strain

No other focal abnormality is noted.

Critical Value/emergent results were called by telephone at the time
of interpretation on 01/26/2017 at [DATE] to Dr. ESAD ALMA KAKO SI , who
verbally acknowledged these results.

## 2019-07-14 ENCOUNTER — Other Ambulatory Visit: Payer: Self-pay | Admitting: Internal Medicine

## 2019-07-14 DIAGNOSIS — Z1231 Encounter for screening mammogram for malignant neoplasm of breast: Secondary | ICD-10-CM

## 2019-08-13 ENCOUNTER — Ambulatory Visit: Payer: Medicare Other

## 2019-11-01 ENCOUNTER — Ambulatory Visit: Payer: Medicare Other

## 2019-11-03 ENCOUNTER — Ambulatory Visit: Payer: Medicare Other | Attending: Internal Medicine

## 2019-11-03 DIAGNOSIS — Z23 Encounter for immunization: Secondary | ICD-10-CM | POA: Insufficient documentation

## 2019-11-03 NOTE — Progress Notes (Signed)
   Covid-19 Vaccination Clinic  Name:  Regina Bell    MRN: [id number] DOB: [birth year]  11/03/2019  Regina Bell was observed post Covid-19 immunization for 15 minutes without incidence. She was provided with Vaccine Information Sheet and instruction to access the V-Safe system.   Regina Bell was instructed to call 911 with any severe reactions post vaccine: Marland Kitchen Difficulty breathing  . Swelling of your face and throat  . A fast heartbeat  . A bad rash all over your body  . Dizziness and weakness    Immunizations Administered    Name Date Dose VIS Date Route   Pfizer COVID-19 Vaccine 11/03/2019 12:37 PM 0.3 mL 09/19/2019 Intramuscular   Manufacturer: [employer], [employer]   Lot: [id number]   NDC: [address]-2

## 2019-11-24 ENCOUNTER — Ambulatory Visit: Payer: Medicare Other | Attending: Internal Medicine

## 2019-11-24 DIAGNOSIS — Z23 Encounter for immunization: Secondary | ICD-10-CM | POA: Insufficient documentation

## 2019-11-24 NOTE — Progress Notes (Signed)
   Covid-19 Vaccination Clinic  Name:  DEADRA DIGGINS    MRN: [id number] DOB: 09-17-1941  11/24/2019  Ms. Digman was observed post Covid-19 immunization for 15 minutes without incidence. She was provided with Vaccine Information Sheet and instruction to access the V-Safe system.   Ms. Lennox was instructed to call 911 with any severe reactions post vaccine: Marland Kitchen Difficulty breathing  . Swelling of your face and throat  . A fast heartbeat  . A bad rash all over your body  . Dizziness and weakness    Immunizations Administered    Name Date Dose VIS Date Route   Pfizer COVID-19 Vaccine 11/24/2019 10:41 AM 0.3 mL 09/19/2019 Intramuscular   Manufacturer: [employer], [employer]   Lot: [id number]   NDC: [address]-5

## 2020-06-22 ENCOUNTER — Ambulatory Visit: Payer: Self-pay | Attending: Internal Medicine

## 2020-06-22 DIAGNOSIS — Z23 Encounter for immunization: Secondary | ICD-10-CM

## 2020-06-22 NOTE — Progress Notes (Signed)
   Covid-19 Vaccination Clinic  Name:  Regina Bell    MRN: [id number] DOB: 24-Jul-1941  06/22/2020  [name] was observed post Covid-19 immunization for 15 minutes without incident. She was provided with Vaccine Information Sheet and instruction to access the V-Safe system. Vaccinated By: [place]sea Mall.  [name] was instructed to call 911 with any severe reactions post vaccine: Marland Kitchen Difficulty breathing  . Swelling of face and throat  . A fast heartbeat  . A bad rash all over body  . Dizziness and weakness

## 2021-02-03 DIAGNOSIS — Z0001 Encounter for general adult medical examination with abnormal findings: Secondary | ICD-10-CM | POA: Diagnosis not present

## 2021-02-03 DIAGNOSIS — Z79899 Other long term (current) drug therapy: Secondary | ICD-10-CM | POA: Diagnosis not present

## 2021-02-03 DIAGNOSIS — I10 Essential (primary) hypertension: Secondary | ICD-10-CM | POA: Diagnosis not present

## 2021-02-03 DIAGNOSIS — E7800 Pure hypercholesterolemia, unspecified: Secondary | ICD-10-CM | POA: Diagnosis not present

## 2021-02-03 DIAGNOSIS — Z1239 Encounter for other screening for malignant neoplasm of breast: Secondary | ICD-10-CM | POA: Diagnosis not present

## 2021-02-03 DIAGNOSIS — E559 Vitamin D deficiency, unspecified: Secondary | ICD-10-CM | POA: Diagnosis not present

## 2021-04-19 DIAGNOSIS — H2513 Age-related nuclear cataract, bilateral: Secondary | ICD-10-CM | POA: Diagnosis not present

## 2021-12-30 ENCOUNTER — Encounter (HOSPITAL_BASED_OUTPATIENT_CLINIC_OR_DEPARTMENT_OTHER): Payer: Self-pay

## 2021-12-30 ENCOUNTER — Other Ambulatory Visit: Payer: Self-pay

## 2021-12-30 ENCOUNTER — Emergency Department (HOSPITAL_BASED_OUTPATIENT_CLINIC_OR_DEPARTMENT_OTHER): Payer: Medicare Other

## 2021-12-30 ENCOUNTER — Other Ambulatory Visit (HOSPITAL_BASED_OUTPATIENT_CLINIC_OR_DEPARTMENT_OTHER): Payer: Self-pay

## 2021-12-30 ENCOUNTER — Emergency Department (HOSPITAL_BASED_OUTPATIENT_CLINIC_OR_DEPARTMENT_OTHER)
Admission: EM | Admit: 2021-12-30 | Discharge: 2021-12-30 | Disposition: A | Discharge status: Home / Self Care | Payer: Medicare Other | Attending: Emergency Medicine | Admitting: Emergency Medicine

## 2021-12-30 DIAGNOSIS — Z20822 Contact with and (suspected) exposure to covid-19: Secondary | ICD-10-CM | POA: Insufficient documentation

## 2021-12-30 DIAGNOSIS — J069 Acute upper respiratory infection, unspecified: Secondary | ICD-10-CM | POA: Insufficient documentation

## 2021-12-30 DIAGNOSIS — R0981 Nasal congestion: Secondary | ICD-10-CM | POA: Diagnosis present

## 2021-12-30 DIAGNOSIS — I517 Cardiomegaly: Secondary | ICD-10-CM | POA: Diagnosis not present

## 2021-12-30 DIAGNOSIS — R059 Cough, unspecified: Secondary | ICD-10-CM | POA: Diagnosis not present

## 2021-12-30 LAB — RESP PANEL BY RT-PCR (FLU A&B, COVID) ARPGX2
Influenza A by PCR: NEGATIVE
Influenza B by PCR: NEGATIVE
SARS Coronavirus 2 by RT PCR: NEGATIVE

## 2021-12-30 MED ORDER — BENZONATATE 100 MG PO CAPS
100.0000 mg | ORAL_CAPSULE | Freq: Three times a day (TID) | ORAL | 0 refills | Status: DC
  Filled 2021-12-30: qty 21, 7d supply, fill #0

## 2021-12-30 NOTE — ED Triage Notes (Signed)
Pt presents POV for 10 days of a "tickling" in her throat causing her to cough, nasal congestion/drainage and sneezing. Denies any relief with OTC day and night time Nyquil  ?

## 2021-12-30 NOTE — ED Provider Notes (Signed)
?[facility] EMERGENCY DEPT ?Provider Note ? ? ?CSN: [id number] ?Arrival date & time: 12/30/21  0845 ? ?  ? ?History ? ?Chief Complaint  ?Patient presents with  ? Cough  ? Nasal Congestion  ? ? ?Regina Bell is a 81 y.o. female. ? ?Patient presents to ER chief complaint of a cough, nasal congestion and sneeze.  Symptoms ongoing for about 10 days.  She been trying NyQuil without significant improvement.  Denies any difficulty breathing denies any chest pain.  Denies fevers.  She states her husband convinced her to come in to get checked out for possible pneumonia.  Denies any vomiting or diarrhea. ? ? ?  ? ?Home Medications ?Prior to Admission medications   ?Medication Sig Start Date End Date Taking? Authorizing Provider  ?benzonatate (TESSALON) 100 MG capsule Take 1 capsule (100 mg total) by mouth every 8 (eight) hours. 12/30/21  Yes Cheryll Cockayne, MD  ?Ascorbic Acid (VITAMIN C PO) Take 1 tablet by mouth daily.    [provider]  ?ECHINACEA PO Take 1 capsule by mouth daily.    [provider]  ?ibuprofen (ADVIL,MOTRIN) 200 MG tablet Take 200-400 mg by mouth every 6 (six) hours as needed (for pain).    [provider]  ?Multiple Vitamins-Minerals (OCUVITE PO) Take 1 tablet by mouth daily.    [provider]  ?Rivaroxaban (XARELTO) 15 MG TABS tablet Take 1 tablet (15 mg total) by mouth 2 (two) times daily with a meal. 01/27/17   Carolynn Comment, MD  ?   ? ?Allergies    ?Patient has no known allergies.   ? ?Review of Systems   ?Review of Systems  ?Constitutional:  Negative for fever.  ?HENT:  Negative for ear pain.   ?Eyes:  Negative for pain.  ?Respiratory:  Positive for cough.   ?Cardiovascular:  Negative for chest pain.  ?Gastrointestinal:  Negative for abdominal pain.  ?Genitourinary:  Negative for flank pain.  ?Musculoskeletal:  Negative for back pain.  ?Skin:  Negative for rash.  ?Neurological:  Negative for headaches.  ? ?Physical Exam ?Updated Vital Signs ?BP  129/60 (BP Location: Right Arm)   Pulse 95   Temp 98.1 ?F (36.7 ?C) (Oral)   Resp 16   SpO2 96%  ?Physical Exam ?Constitutional:   ?   General: She is not in acute distress. ?   Appearance: Normal appearance.  ?HENT:  ?   Head: Normocephalic.  ?   Nose: Nose normal.  ?Eyes:  ?   Extraocular Movements: Extraocular movements intact.  ?Cardiovascular:  ?   Rate and Rhythm: Normal rate.  ?Pulmonary:  ?   Effort: Pulmonary effort is normal. No respiratory distress.  ?   Breath sounds: No wheezing, rhonchi or rales.  ?Musculoskeletal:     ?   General: Normal range of motion.  ?   Cervical back: Normal range of motion.  ?Neurological:  ?   General: No focal deficit present.  ?   Mental Status: She is alert. Mental status is at baseline.  ? ? ?ED Results / Procedures / Treatments   ?Labs ?(all labs ordered are listed, but only abnormal results are displayed) ?Labs Reviewed  ?RESP PANEL BY RT-PCR (FLU A&B, COVID) [id number]  ? ? ?EKG ?None ? ?Radiology ?DG Chest Port 1 View ? ?Result Date: 12/30/2021 ?CLINICAL DATA:  Cough EXAM: PORTABLE CHEST 1 VIEW COMPARISON:  Chest x-ray 01/26/2017 FINDINGS: Cardiomegaly and mediastinum appear unchanged. Calcified plaques in the aortic arch. Pulmonary vasculature is normal.  No focal consolidation, pleural effusion or pneumothorax identified. IMPRESSION: Cardiomegaly with no acute process identified. Electronically Signed   By: Jannifer Hick M.D.   On: 12/30/2021 10:06   ? ?Procedures ?Procedures  ? ? ?Medications Ordered in ED ?Medications - No data to display ? ?ED Course/ Medical Decision Making/ A&P ?  ?                        ?Medical Decision Making ?Amount and/or Complexity of Data Reviewed ?Radiology: ordered. ? ? ?Review of record shows primary care office visit April 29, 2021. ? ?History obtained from the patient as well as her husband at bedside. ? ?Patient on cardiac monitor, sinus rhythm. ? ?Work-up includes COVID test which were negative.  Chest x-ray is unremarkable no  pneumonia noted per radiologist.  Vital signs remained stable. ? ?Given normal exam and normal vitals normal imaging, patient discharged home in stable condition advised continued cough drops and over-the-counter medications.  Advised outpatient follow-up with her primary care doctor within the week.  Recommending immediate return for worsening symptoms or any additional concerns. ? ? ? ? ? ? ? ?Final Clinical Impression(s) / ED Diagnoses ?Final diagnoses:  ?Upper respiratory tract infection, unspecified type  ? ? ?Rx / DC Orders ?ED Discharge Orders   ? ?      Ordered  ?  benzonatate (TESSALON) 100 MG capsule  Every 8 hours       ? 12/30/21 1041  ? ?  ?  ? ?  ? ? ?  ?Cheryll Cockayne, MD ?12/30/21 1041 ? ?

## 2021-12-30 NOTE — Discharge Instructions (Addendum)
Your x-ray did not show any pneumonia.  Your COVID test was also normal. ? ?Call your primary care doctor or specialist as discussed in the next 2-3 days.   ?Return immediately back to the ER if: ? ?Your symptoms worsen within the next 12-24 hours. ?You develop new symptoms such as new fevers, persistent vomiting, new pain, shortness of breath, or new weakness or numbness, or if you have any other concerns. ? ?

## 2021-12-30 NOTE — ED Notes (Signed)
Pt arm resting on bed rail when BP taken ?

## 2022-06-08 DIAGNOSIS — E7800 Pure hypercholesterolemia, unspecified: Secondary | ICD-10-CM | POA: Diagnosis not present

## 2022-06-08 DIAGNOSIS — Z0000 Encounter for general adult medical examination without abnormal findings: Secondary | ICD-10-CM | POA: Diagnosis not present

## 2022-06-08 DIAGNOSIS — Z1239 Encounter for other screening for malignant neoplasm of breast: Secondary | ICD-10-CM | POA: Diagnosis not present

## 2022-06-08 DIAGNOSIS — Z79899 Other long term (current) drug therapy: Secondary | ICD-10-CM | POA: Diagnosis not present

## 2022-06-08 DIAGNOSIS — I10 Essential (primary) hypertension: Secondary | ICD-10-CM | POA: Diagnosis not present

## 2022-06-08 DIAGNOSIS — E559 Vitamin D deficiency, unspecified: Secondary | ICD-10-CM | POA: Diagnosis not present

## 2022-06-16 DIAGNOSIS — N1330 Unspecified hydronephrosis: Secondary | ICD-10-CM | POA: Diagnosis not present

## 2022-06-16 DIAGNOSIS — R319 Hematuria, unspecified: Secondary | ICD-10-CM | POA: Diagnosis not present

## 2022-06-20 ENCOUNTER — Other Ambulatory Visit: Payer: Self-pay | Admitting: Internal Medicine

## 2022-06-20 DIAGNOSIS — N1330 Unspecified hydronephrosis: Secondary | ICD-10-CM

## 2022-06-23 DIAGNOSIS — H6121 Impacted cerumen, right ear: Secondary | ICD-10-CM | POA: Diagnosis not present

## 2022-12-01 DIAGNOSIS — N1832 Chronic kidney disease, stage 3b: Secondary | ICD-10-CM | POA: Diagnosis not present

## 2022-12-01 DIAGNOSIS — E7800 Pure hypercholesterolemia, unspecified: Secondary | ICD-10-CM | POA: Diagnosis not present

## 2022-12-01 DIAGNOSIS — I10 Essential (primary) hypertension: Secondary | ICD-10-CM | POA: Diagnosis not present

## 2023-07-26 IMAGING — DX DG CHEST 1V PORT
1 series · 1 of 1 positions shown · non-contrast
Comparison: Chest x-ray 01/26/2017

CLINICAL DATA: Cough

EXAM:
PORTABLE CHEST 1 VIEW

[chest ap]
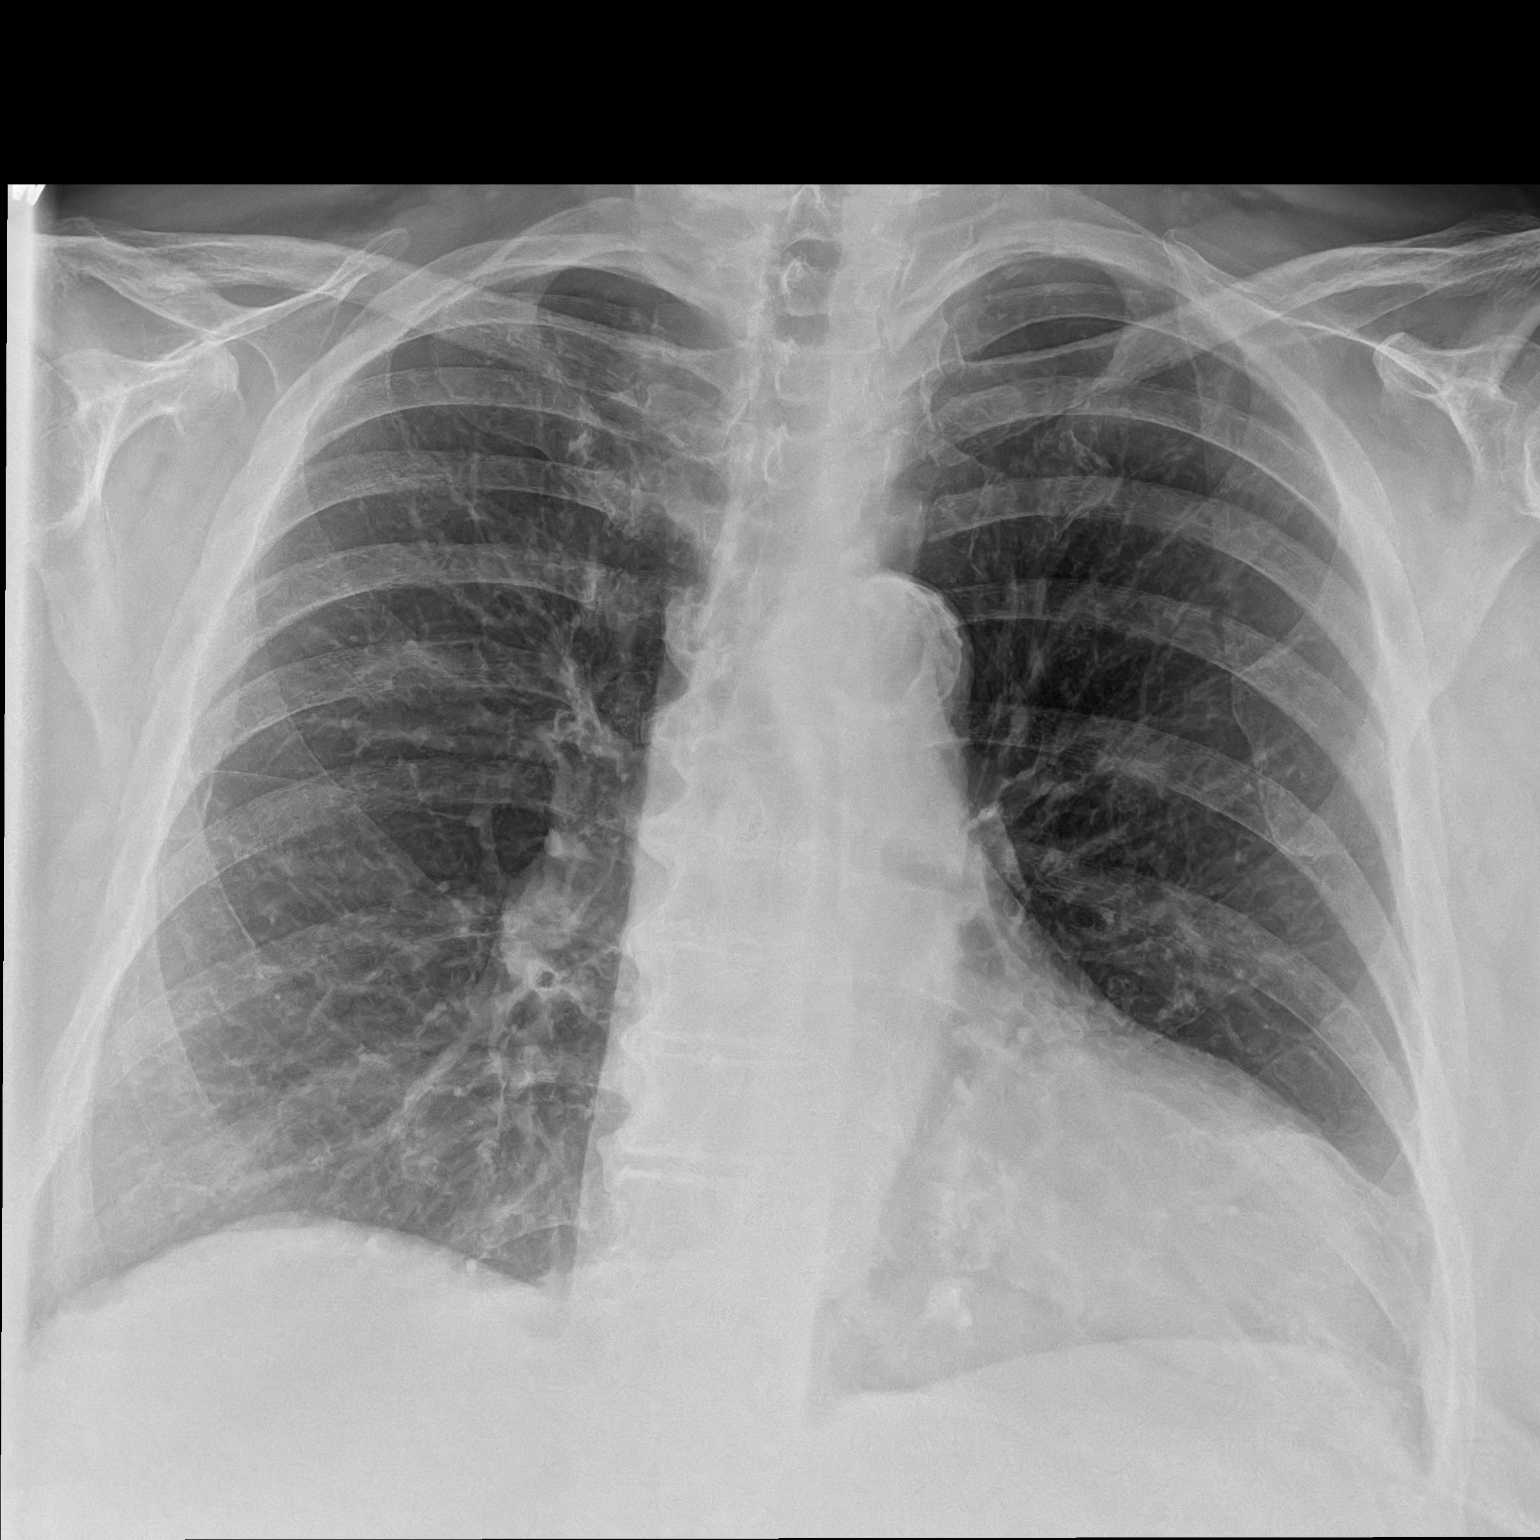

[1 of 1 positions shown; findings below may reference images not displayed]

FINDINGS: Cardiomegaly and mediastinum appear unchanged. Calcified plaques in
the aortic arch. Pulmonary vasculature is normal. No focal
consolidation, pleural effusion or pneumothorax identified.
IMPRESSION: Cardiomegaly with no acute process identified.

## 2024-02-19 ENCOUNTER — Other Ambulatory Visit: Payer: Self-pay

## 2024-02-19 ENCOUNTER — Emergency Department (HOSPITAL_BASED_OUTPATIENT_CLINIC_OR_DEPARTMENT_OTHER)

## 2024-02-19 ENCOUNTER — Encounter (HOSPITAL_BASED_OUTPATIENT_CLINIC_OR_DEPARTMENT_OTHER): Payer: Self-pay | Admitting: Emergency Medicine

## 2024-02-19 ENCOUNTER — Inpatient Hospital Stay (HOSPITAL_BASED_OUTPATIENT_CLINIC_OR_DEPARTMENT_OTHER)
Admission: EM | Admit: 2024-02-19 | Discharge: 2024-02-24 | DRG: 291 | Disposition: A | Discharge status: Home / Self Care | Attending: Internal Medicine | Admitting: Internal Medicine

## 2024-02-19 ENCOUNTER — Emergency Department (HOSPITAL_BASED_OUTPATIENT_CLINIC_OR_DEPARTMENT_OTHER): Admitting: Radiology

## 2024-02-19 DIAGNOSIS — E876 Hypokalemia: Secondary | ICD-10-CM | POA: Diagnosis present

## 2024-02-19 DIAGNOSIS — J90 Pleural effusion, not elsewhere classified: Secondary | ICD-10-CM | POA: Diagnosis not present

## 2024-02-19 DIAGNOSIS — I340 Nonrheumatic mitral (valve) insufficiency: Secondary | ICD-10-CM | POA: Diagnosis present

## 2024-02-19 DIAGNOSIS — J918 Pleural effusion in other conditions classified elsewhere: Secondary | ICD-10-CM | POA: Diagnosis present

## 2024-02-19 DIAGNOSIS — Z1152 Encounter for screening for COVID-19: Secondary | ICD-10-CM

## 2024-02-19 DIAGNOSIS — I272 Pulmonary hypertension, unspecified: Secondary | ICD-10-CM | POA: Diagnosis present

## 2024-02-19 DIAGNOSIS — I7121 Aneurysm of the ascending aorta, without rupture: Secondary | ICD-10-CM | POA: Diagnosis not present

## 2024-02-19 DIAGNOSIS — I5082 Biventricular heart failure: Secondary | ICD-10-CM | POA: Diagnosis not present

## 2024-02-19 DIAGNOSIS — Z86711 Personal history of pulmonary embolism: Secondary | ICD-10-CM

## 2024-02-19 DIAGNOSIS — R918 Other nonspecific abnormal finding of lung field: Secondary | ICD-10-CM | POA: Diagnosis not present

## 2024-02-19 DIAGNOSIS — I5020 Unspecified systolic (congestive) heart failure: Secondary | ICD-10-CM | POA: Diagnosis not present

## 2024-02-19 DIAGNOSIS — R7989 Other specified abnormal findings of blood chemistry: Secondary | ICD-10-CM

## 2024-02-19 DIAGNOSIS — I255 Ischemic cardiomyopathy: Secondary | ICD-10-CM | POA: Diagnosis not present

## 2024-02-19 DIAGNOSIS — E785 Hyperlipidemia, unspecified: Secondary | ICD-10-CM | POA: Diagnosis present

## 2024-02-19 DIAGNOSIS — I471 Supraventricular tachycardia, unspecified: Secondary | ICD-10-CM | POA: Diagnosis not present

## 2024-02-19 DIAGNOSIS — Z87891 Personal history of nicotine dependence: Secondary | ICD-10-CM

## 2024-02-19 DIAGNOSIS — I428 Other cardiomyopathies: Secondary | ICD-10-CM | POA: Diagnosis not present

## 2024-02-19 DIAGNOSIS — I493 Ventricular premature depolarization: Secondary | ICD-10-CM | POA: Diagnosis present

## 2024-02-19 DIAGNOSIS — I2510 Atherosclerotic heart disease of native coronary artery without angina pectoris: Secondary | ICD-10-CM | POA: Diagnosis not present

## 2024-02-19 DIAGNOSIS — E66811 Obesity, class 1: Secondary | ICD-10-CM | POA: Diagnosis present

## 2024-02-19 DIAGNOSIS — R0602 Shortness of breath: Secondary | ICD-10-CM | POA: Diagnosis not present

## 2024-02-19 DIAGNOSIS — K761 Chronic passive congestion of liver: Secondary | ICD-10-CM | POA: Diagnosis not present

## 2024-02-19 DIAGNOSIS — N179 Acute kidney failure, unspecified: Secondary | ICD-10-CM | POA: Diagnosis not present

## 2024-02-19 DIAGNOSIS — N390 Urinary tract infection, site not specified: Secondary | ICD-10-CM | POA: Insufficient documentation

## 2024-02-19 DIAGNOSIS — N3000 Acute cystitis without hematuria: Secondary | ICD-10-CM | POA: Diagnosis not present

## 2024-02-19 DIAGNOSIS — R0600 Dyspnea, unspecified: Secondary | ICD-10-CM | POA: Diagnosis not present

## 2024-02-19 DIAGNOSIS — I5021 Acute systolic (congestive) heart failure: Secondary | ICD-10-CM | POA: Diagnosis not present

## 2024-02-19 DIAGNOSIS — I110 Hypertensive heart disease with heart failure: Principal | ICD-10-CM | POA: Diagnosis present

## 2024-02-19 DIAGNOSIS — Z6831 Body mass index (BMI) 31.0-31.9, adult: Secondary | ICD-10-CM

## 2024-02-19 DIAGNOSIS — I2489 Other forms of acute ischemic heart disease: Secondary | ICD-10-CM | POA: Diagnosis not present

## 2024-02-19 DIAGNOSIS — I5043 Acute on chronic combined systolic (congestive) and diastolic (congestive) heart failure: Secondary | ICD-10-CM | POA: Diagnosis present

## 2024-02-19 DIAGNOSIS — I517 Cardiomegaly: Secondary | ICD-10-CM | POA: Diagnosis not present

## 2024-02-19 DIAGNOSIS — I252 Old myocardial infarction: Secondary | ICD-10-CM

## 2024-02-19 DIAGNOSIS — Z6829 Body mass index (BMI) 29.0-29.9, adult: Secondary | ICD-10-CM

## 2024-02-19 DIAGNOSIS — J9601 Acute respiratory failure with hypoxia: Principal | ICD-10-CM

## 2024-02-19 DIAGNOSIS — Z79899 Other long term (current) drug therapy: Secondary | ICD-10-CM

## 2024-02-19 DIAGNOSIS — I509 Heart failure, unspecified: Secondary | ICD-10-CM | POA: Diagnosis not present

## 2024-02-19 LAB — URINALYSIS, ROUTINE W REFLEX MICROSCOPIC
Bilirubin Urine: NEGATIVE
Glucose, UA: NEGATIVE mg/dL
Hgb urine dipstick: NEGATIVE
Ketones, ur: NEGATIVE mg/dL
Nitrite: POSITIVE — AB
Protein, ur: NEGATIVE mg/dL
Specific Gravity, Urine: 1.008 (ref 1.005–1.030)
WBC, UA: >50 WBC/hpf (ref 0–5)
pH: 7.5 (ref 5.0–8.0)

## 2024-02-19 LAB — CBC WITH DIFFERENTIAL/PLATELET
Abs Immature Granulocytes: 0.03 10*3/uL (ref 0.00–0.07)
Basophils Absolute: 0 10*3/uL (ref 0.0–0.1)
Basophils Relative: 0 %
Eosinophils Absolute: 0.1 10*3/uL (ref 0.0–0.5)
Eosinophils Relative: 1 %
HCT: 41.2 % (ref 36.0–46.0)
Hemoglobin: 13.4 g/dL (ref 12.0–15.0)
Immature Granulocytes: 0 %
Lymphocytes Relative: 13 %
Lymphs Abs: 1.2 10*3/uL (ref 0.7–4.0)
MCH: 30.2 pg (ref 26.0–34.0)
MCHC: 32.5 g/dL (ref 30.0–36.0)
MCV: 92.8 fL (ref 80.0–100.0)
Monocytes Absolute: 0.5 10*3/uL (ref 0.1–1.0)
Monocytes Relative: 6 %
Neutro Abs: 7.3 10*3/uL (ref 1.7–7.7)
Neutrophils Relative %: 80 %
Platelets: 209 10*3/uL (ref 150–400)
RBC: 4.44 MIL/uL (ref 3.87–5.11)
RDW: 14.1 % (ref 11.5–15.5)
WBC: 9.2 10*3/uL (ref 4.0–10.5)
nRBC: 0 % (ref 0.0–0.2)

## 2024-02-19 LAB — COMPREHENSIVE METABOLIC PANEL WITH GFR
ALT: 493 U/L — ABNORMAL HIGH (ref 0–44)
AST: 218 U/L — ABNORMAL HIGH (ref 15–41)
Albumin: 3.6 g/dL (ref 3.5–5.0)
Alkaline Phosphatase: 43 U/L (ref 38–126)
Anion gap: 17 — ABNORMAL HIGH (ref 5–15)
BUN: 39 mg/dL — ABNORMAL HIGH (ref 8–23)
CO2: 21 mmol/L — ABNORMAL LOW (ref 22–32)
Calcium: 9.7 mg/dL (ref 8.9–10.3)
Chloride: 104 mmol/L (ref 98–111)
Creatinine, Ser: 1.68 mg/dL — ABNORMAL HIGH (ref 0.44–1.00)
GFR, Estimated: 30 mL/min — ABNORMAL LOW (ref >60)
Glucose, Bld: 126 mg/dL — ABNORMAL HIGH (ref 70–99)
Potassium: 4.2 mmol/L (ref 3.5–5.1)
Sodium: 141 mmol/L (ref 135–145)
Total Bilirubin: 1.1 mg/dL (ref 0.0–1.2)
Total Protein: 6.3 g/dL — ABNORMAL LOW (ref 6.5–8.1)

## 2024-02-19 LAB — D-DIMER, QUANTITATIVE: D-Dimer, Quant: 8.48 ug{FEU}/mL — ABNORMAL HIGH (ref 0.00–0.50)

## 2024-02-19 LAB — RESP PANEL BY RT-PCR (RSV, FLU A&B, COVID)  RVPGX2
Influenza A by PCR: NEGATIVE
Influenza B by PCR: NEGATIVE
Resp Syncytial Virus by PCR: NEGATIVE
SARS Coronavirus 2 by RT PCR: NEGATIVE

## 2024-02-19 LAB — PRO BRAIN NATRIURETIC PEPTIDE: Pro Brain Natriuretic Peptide: >35000 pg/mL — ABNORMAL HIGH (ref <300.0)

## 2024-02-19 LAB — TROPONIN T, HIGH SENSITIVITY
Troponin T High Sensitivity: 288 ng/L (ref <19)
Troponin T High Sensitivity: 286 ng/L (ref <19)

## 2024-02-19 MED ORDER — SODIUM CHLORIDE 0.9 % IV SOLN
1.0000 g | INTRAVENOUS | Status: DC
  Administered 2024-02-20 – 2024-02-22 (×3): 1 g via INTRAVENOUS
  Filled 2024-02-19 (×3): qty 10

## 2024-02-19 MED ORDER — SODIUM CHLORIDE 0.9 % IV SOLN
1.0000 g | Freq: Once | INTRAVENOUS | Status: AC
  Administered 2024-02-19: 1 g via INTRAVENOUS
  Filled 2024-02-19: qty 10

## 2024-02-19 MED ORDER — HEPARIN SODIUM (PORCINE) 5000 UNIT/ML IJ SOLN
5000.0000 [IU] | Freq: Three times a day (TID) | INTRAMUSCULAR | Status: DC
  Administered 2024-02-19 – 2024-02-24 (×14): 5000 [IU] via SUBCUTANEOUS
  Filled 2024-02-19 (×14): qty 1

## 2024-02-19 MED ORDER — SODIUM CHLORIDE 0.9% FLUSH
3.0000 mL | Freq: Two times a day (BID) | INTRAVENOUS | Status: DC
  Administered 2024-02-19 – 2024-02-24 (×10): 3 mL via INTRAVENOUS

## 2024-02-19 MED ORDER — FUROSEMIDE 10 MG/ML IJ SOLN
40.0000 mg | Freq: Two times a day (BID) | INTRAMUSCULAR | Status: DC
  Administered 2024-02-19 – 2024-02-21 (×4): 40 mg via INTRAVENOUS
  Filled 2024-02-19 (×4): qty 4

## 2024-02-19 MED ORDER — ACETAMINOPHEN 650 MG RE SUPP: 650.0000 mg | Freq: Four times a day (QID) | RECTAL | Status: DC | PRN

## 2024-02-19 MED ORDER — FUROSEMIDE 10 MG/ML IJ SOLN
40.0000 mg | Freq: Once | INTRAMUSCULAR | Status: AC
  Administered 2024-02-19: 40 mg via INTRAVENOUS
  Filled 2024-02-19: qty 4

## 2024-02-19 MED ORDER — SODIUM CHLORIDE 0.9 % IV SOLN: INTRAVENOUS | Status: AC | PRN

## 2024-02-19 MED ORDER — ACETAMINOPHEN 325 MG PO TABS: 650.0000 mg | ORAL_TABLET | Freq: Four times a day (QID) | ORAL | Status: DC | PRN

## 2024-02-19 MED ORDER — IOHEXOL 350 MG/ML SOLN
100.0000 mL | Freq: Once | INTRAVENOUS | Status: AC | PRN
  Administered 2024-02-19: 50 mL via INTRAVENOUS

## 2024-02-19 MED ORDER — POLYETHYLENE GLYCOL 3350 17 G PO PACK: 17.0000 g | PACK | Freq: Every day | ORAL | Status: DC | PRN

## 2024-02-19 NOTE — ED Notes (Signed)
 RT Note: Patient was unable to get up to ambulate at this time due to a room air oxygen saturation 87% while at rest. She was placed on 2lpm [place] . Patient is tolerating 2lpm  with an oxygen saturation 93-94% currently

## 2024-02-19 NOTE — ED Notes (Signed)
 Kim with cl called for transport

## 2024-02-19 NOTE — ED Triage Notes (Signed)
 C/o increased SHOB over the last week. Worse om exertion. Denies recent illness or cough,

## 2024-02-19 NOTE — ED Provider Notes (Cosign Needed Addendum)
 [place] EMERGENCY DEPARTMENT AT [facility] Provider Note   CSN: [id number] Arrival date & time: 02/19/24  0845     History  Chief Complaint  Patient presents with   Shortness of Breath    Regina Bell is a 83 y.o. female.  Patient with no significant past medical history --presents to the emergency department today for evaluation of shortness of breath.  Symptoms started about a week ago.  Patient reports fatigue and shortness of breath.  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  She denies associated fevers, cough, URI symptoms.  No chest pain or abdominal pain.  No vomiting or diarrhea.  No urinary symptoms with this.  She thought that she was getting better, however this morning felt worse again and could not work.  She just describes a shortness of breath, worse with activity.  No new symptoms this morning.  She denies leg swelling.       Home Medications Prior to Admission medications   Medication Sig Start Date End Date Taking? Authorizing Provider  Ascorbic Acid (VITAMIN C PO) Take 1 tablet by mouth daily.    [provider]  benzonatate  (TESSALON ) 100 MG capsule Take 1 capsule (100 mg total) by mouth every 8 (eight) hours. 12/30/21   Billie Budge, MD  ECHINACEA PO Take 1 capsule by mouth daily.    [provider]  ibuprofen (ADVIL,MOTRIN) 200 MG tablet Take 200-400 mg by mouth every 6 (six) hours as needed (for pain).    [provider]  Multiple Vitamins-Minerals (OCUVITE PO) Take 1 tablet by mouth daily.    [provider]  Rivaroxaban  (XARELTO ) 15 MG TABS tablet Take 1 tablet (15 mg total) by mouth 2 (two) times daily with a meal. 01/27/17   Rosanne Commodore, MD      Allergies    Patient has no known allergies.    Review of Systems   Review of Systems  Physical Exam Updated Vital Signs BP (!) 162/97 (BP Location:  Left Arm)   Pulse 87   Temp 98.4 F (36.9 C) (Oral)   Resp 18   SpO2 93%   Physical Exam Vitals and nursing note reviewed.  Constitutional:      Appearance: She is well-developed. She is not diaphoretic.  HENT:     Head: Normocephalic and atraumatic.     Mouth/Throat:     Mouth: Mucous membranes are not dry.  Eyes:     Conjunctiva/sclera: Conjunctivae normal.  Neck:     Vascular: Normal carotid pulses. No JVD.     Trachea: Trachea normal. No tracheal deviation.  Cardiovascular:     Rate and Rhythm: Normal rate and regular rhythm. Frequent Extrasystoles are present.    Pulses: No decreased pulses.          Radial pulses are 2+ on the right side and 2+ on the left side.     Heart sounds: Normal heart sounds, S1 normal and S2 normal. No murmur heard. Pulmonary:     Effort: Pulmonary effort is normal. No respiratory distress.     Breath sounds: No wheezing, rhonchi or rales.  Chest:     Chest wall: No tenderness.  Abdominal:     General: Bowel sounds are normal.     Palpations: Abdomen is soft.     Tenderness: There is no abdominal tenderness. There is no guarding or rebound.  Musculoskeletal:        General: Normal range of motion.     Cervical back: Normal range of motion and neck supple. No muscular tenderness.  Skin:    General: Skin is warm and dry.     Coloration: Skin is not pale.  Neurological:     Mental Status: She is alert.     ED Results / Procedures / Treatments   Labs (all labs ordered are listed, but only abnormal results are displayed) Labs Reviewed  PRO BRAIN NATRIURETIC PEPTIDE - Abnormal; Notable for the following components:      Result Value   Pro Brain Natriuretic Peptide >35,000.0 (*)    All other components within normal limits  COMPREHENSIVE METABOLIC PANEL WITH GFR - Abnormal; Notable for the following components:   CO2 21 (*)    Glucose, Bld 126 (*)    BUN 39 (*)    Creatinine, Ser 1.68 (*)    Total Protein 6.3 (*)    AST 218 (*)     ALT 493 (*)    GFR, Estimated 30 (*)    Anion gap 17 (*)    All other components within normal limits  URINALYSIS, ROUTINE W REFLEX MICROSCOPIC - Abnormal; Notable for the following components:   APPearance HAZY (*)    Nitrite POSITIVE (*)    Leukocytes,Ua LARGE (*)    Bacteria, UA FEW (*)    All other components within normal limits  D-DIMER, QUANTITATIVE - Abnormal; Notable for the following components:   D-Dimer, Quant 8.48 (*)    All other components within normal limits  TROPONIN T, HIGH SENSITIVITY - Abnormal; Notable for the following components:   Troponin T High Sensitivity 288 (*)    All other components within normal limits  TROPONIN T, HIGH SENSITIVITY - Abnormal; Notable for the following components:   Troponin T High Sensitivity 286 (*)    All other components within normal limits  RESP PANEL BY RT-PCR (RSV, FLU A&B, COVID)  [id number]  CBC WITH DIFFERENTIAL/PLATELET    EKG EKG Interpretation Date/Time:  Tuesday Feb 19 2024 08:55:50 EDT Ventricular Rate:  91 PR Interval:  166 QRS Duration:  126 QT Interval:  389 QTC Calculation: 479 R Axis:   -71  Text Interpretation: Sinus rhythm Ventricular trigeminy Probable left atrial enlargement Left bundle branch block Trigeminy new in comprison to prior although previous also with frequent PVCs Confirmed by Scarlette Currier ([id number]) on 02/19/2024 9:36:24 AM  Radiology DG Chest Port 1 View Result Date: 02/19/2024 CLINICAL DATA:  [id number] SOB (shortness of breath) [id number] EXAM: PORTABLE CHEST - 1 VIEW COMPARISON:  None available. FINDINGS: Retrocardiac airspace consolidation with blunting of the costophrenic sulcus. Blunting of the right costophrenic angle with patchy right basilar airspace opacities, also present. No pneumothorax. Skin fold artifact along the right lung. Mild cardiomegaly. Tortuous aorta with aortic atherosclerosis. No acute fracture or destructive lesions. Multilevel thoracic osteophytosis. Osteopenia. IMPRESSION:  Small bilateral pleural effusions, larger on the left than the right, with bibasilar airspace opacities, likely atelectasis. Electronically Signed   By: Rance Burrows M.D.   On: 02/19/2024 10:23    Procedures Procedures    Medications Ordered in ED Medications  0.9 %  sodium chloride  infusion ( Intravenous New Bag/Given 02/19/24 1055)  furosemide (LASIX) injection 40 mg (40 mg Intravenous Given 02/19/24 1050)  cefTRIAXone (ROCEPHIN) 1 g in sodium chloride  0.9 % 100 mL IVPB (1 g Intravenous New Bag/Given 02/19/24 1056)    ED Course/ Medical Decision Making/ A&P  Patient seen and examined. History obtained directly from patient.  Of note, there are some history elements in the patient's chart that does not match given history.  She tells me that she has never had a blood clot, chronic kidney disease, hypertension.  I am not sure what the error is, name and date of birth seem to be accurate. RN made aware.   Labs/EKG: Ordered CBC, CMP, troponin, BNP, COVID, UA, EKG.  Imaging: Ordered chest x-ray 2 view  Medications/Fluids: None ordered  Most recent vital signs reviewed and are as follows: BP (!) 162/97 (BP Location: Left Arm)   Pulse 87   Temp 98.4 F (36.9 C) (Oral)   Resp 18   SpO2 93%   Initial impression: Shortness of breath.  Will ask that patient ambulate on pulse ox.  At rest her O2 sat is between 89 and 91%.  9:41 AM patient hypoxic to 87% on RT evaluation.  Ambulation was not attempted.  Patient was placed on 2 L nasal cannula.  10:42 AM Reassessment performed. Patient appears comfortable.  Updated on results.  Labs personally reviewed and interpreted including: CBC with differential with normal white blood cell count and hemoglobin; CMP with creatinine 1.68, BUN of 39, glucose 126, potassium 4.2; proBNP markedly elevated greater than 35,000; troponin elevated to 286.  Imaging personally visualized and interpreted including: Chest x-ray with some diffuse airspace  disease, bilateral pleural effusions noted, left greater than right.  Reviewed pertinent lab work and imaging with patient at bedside. Questions answered.   Most current vital signs reviewed and are as follows: BP (!) 164/112   Pulse 77   Temp 98.4 F (36.9 C) (Oral)   Resp (!) 30   SpO2 96%   Plan: Discussed with Dr. Tamela Fake.  Will obtain D-dimer.  12:30 PM Reassessment performed. Patient appears stable.  Labs personally reviewed and interpreted including: D-dimer elevated 8.48.  Imaging personally visualized and interpreted including: CTA of the chest, no central PE, moderate pleural effusion.  Second troponin flat trend.  Reviewed pertinent lab work and imaging with patient at bedside. Questions answered.   Most current vital signs reviewed and are as follows: BP (!) 168/92   Pulse 79   Temp 98.4 F (36.9 C) (Oral)   Resp (!) 23   SpO2 96%   Plan: Will admit to the hospital.  Discussed with Dr. Rufina Cough [facility] hospitalist who will admit patient.  2:09 PM Reviewed imaging, no PE but opacification not adequate RLL, no central PE, concern for ascending aortic aneurysm, concern for PAH.                                   Medical Decision Making Amount and/or Complexity of Data Reviewed Labs: ordered. Radiology: ordered.  Risk Prescription drug management. Decision regarding hospitalization.   Patient with respiratory failure, hypoxic, likely due to acute, new onset CHF.  Low concern for pneumonia, but patient does have signs of UTI.  Troponin elevated but trend flat.  EKG without signs of ischemia but that showed ventricular trigeminy.  Patient has required 2 L nasal cannula oxygen to maintain oxygen saturation above 90%.  Will need admission for further workup.        Final Clinical Impression(s) / ED Diagnoses Final diagnoses:  Acute respiratory failure with hypoxia (HCC)  Pleural effusion  Elevated troponin  Elevated serum creatinine    Rx / DC  Orders ED Discharge Orders  None         Lyna Sandhoff, PA-C 02/19/24 1329    Lyna Sandhoff, PA-C 02/19/24 1413    Scarlette Currier, MD 02/20/24 [id number]

## 2024-02-19 NOTE — ED Notes (Signed)
 Patient transported to CT

## 2024-02-19 NOTE — ED Notes (Signed)
 Attempt to call report no answer from floor RN

## 2024-02-19 NOTE — H&P (Signed)
 History and Physical   Regina Bell:[id number] DOB: [birth year] DOA: 02/19/2024  PCP: Ozell Blunt, MD   Patient coming from: Home  Chief Complaint: Shortness of breath  HPI: Regina Bell is a 83 y.o. female with medical history significant of pulmonary embolism presenting with worsening shortness of breath.  Patient has had about 1 week of worsening shortness of breath and fatigue.  Thought was getting better over the weekend but then shortness of breath was worse today.  Denies fevers, chills, chest pain, abdominal pain, constipation, diarrhea, nausea, vomiting.  ED Course: Vital signs in the ED notable for blood pressure in the 160s-190 systolic, respiratory rate in the 20s to 30s, requiring 2 L to maintain saturations.  Lab workup included CMP with bicarb 21, BUN 39, creatinine elevated to 1.68 from unclear baseline, glucose 126, anion gap 17, protein 6.3, AST 218, ALT 493.  CBC within normal limits.  Troponin T 288 and then flat at 286.  proBNP elevated to greater than 35,000.  D-dimer elevated to 8.48.  Respiratory panel for flu COVID RSV negative.  Urinalysis with nitrites, leukocytes, bacteria.  Chest x-ray showed small pleural effusion and possible left sided opacity.  CTA PE study was negative for large PE but was limited by contrast timing.  Did show a 4.2 cm aortic aneurysm recommending for follow-up.  Also noted was moderate pleural effusions bilaterally, cardiomegaly, changes consistent with pulmonary hypertension, coronary calcifications.  Patient received ceftriaxone and Lasix in the ED.  Review of Systems: As per HPI otherwise all other systems reviewed and are negative.  History reviewed. No pertinent past medical history.  History reviewed. No pertinent surgical history.  Social History  reports that she has quit smoking. She has never used smokeless tobacco. She reports that she does not drink alcohol and does not use drugs.  No Known  Allergies  History reviewed. No pertinent family history.  Prior to Admission medications   Medication Sig Start Date End Date Taking? Authorizing Provider  OVER THE COUNTER MEDICATION Take 2 tablets by mouth every morning. *otc called blood flow*   Yes [provider]  benzonatate  (TESSALON ) 100 MG capsule Take 1 capsule (100 mg total) by mouth every 8 (eight) hours. Patient not taking: Reported on 02/19/2024 12/30/21   Billie Budge, MD  Rivaroxaban  (XARELTO ) 15 MG TABS tablet Take 1 tablet (15 mg total) by mouth 2 (two) times daily with a meal. Patient not taking: Reported on 02/19/2024 01/27/17   Rosanne Commodore, MD    Physical Exam: Vitals:   02/19/24 1030 02/19/24 1130 02/19/24 1215 02/19/24 1559  BP: (!) 168/92 (!) 191/78 (!) 173/92 (!) 154/92  Pulse: 79 (!) 28 81 62  Resp: (!) 23 (!) 33 (!) 28 (!) 21  Temp:   98.5 F (36.9 C) 98.5 F (36.9 C)  TempSrc:      SpO2: 96% 96% 97% 95%    Physical Exam Constitutional:      General: She is not in acute distress.    Appearance: Normal appearance.  HENT:     Head: Normocephalic and atraumatic.     Mouth/Throat:     Mouth: Mucous membranes are moist.     Pharynx: Oropharynx is clear.  Eyes:     Extraocular Movements: Extraocular movements intact.     Pupils: Pupils are equal, round, and reactive to light.  Cardiovascular:     Rate and Rhythm: Normal rate and regular rhythm.     Pulses: Normal pulses.  Heart sounds: Normal heart sounds.  Pulmonary:     Effort: Pulmonary effort is normal. No respiratory distress.     Breath sounds: Examination of the right-lower field reveals decreased breath sounds. Examination of the left-lower field reveals decreased breath sounds. Decreased breath sounds present.  Abdominal:     General: Bowel sounds are normal. There is no distension.     Palpations: Abdomen is soft.     Tenderness: There is no abdominal tenderness.  Musculoskeletal:        General: No swelling or deformity.   Skin:    General: Skin is warm and dry.  Neurological:     General: No focal deficit present.     Mental Status: Mental status is at baseline.    Labs on Admission: I have personally reviewed following labs and imaging studies  CBC: Recent Labs  Lab 02/19/24 0933  WBC 9.2  NEUTROABS 7.3  HGB 13.4  HCT 41.2  MCV 92.8  PLT 209    Basic Metabolic Panel: Recent Labs  Lab 02/19/24 0933  NA 141  K 4.2  CL 104  CO2 21*  GLUCOSE 126*  BUN 39*  CREATININE 1.68*  CALCIUM 9.7    GFR: CrCl cannot be calculated (Unknown ideal weight.).  Liver Function Tests: Recent Labs  Lab 02/19/24 0933  AST 218*  ALT 493*  ALKPHOS 43  BILITOT 1.1  PROT 6.3*  ALBUMIN 3.6    Urine analysis:    Component Value Date/Time   COLORURINE YELLOW 02/19/2024 0933   APPEARANCEUR HAZY (A) 02/19/2024 0933   LABSPEC 1.008 02/19/2024 0933   PHURINE 7.5 02/19/2024 0933   GLUCOSEU NEGATIVE 02/19/2024 0933   HGBUR NEGATIVE 02/19/2024 0933   BILIRUBINUR NEGATIVE 02/19/2024 0933   KETONESUR NEGATIVE 02/19/2024 0933   PROTEINUR NEGATIVE 02/19/2024 0933   NITRITE POSITIVE (A) 02/19/2024 0933   LEUKOCYTESUR LARGE (A) 02/19/2024 0933    Radiological Exams on Admission: CT Angio Chest PE W and/or Wo Contrast Result Date: 02/19/2024 CLINICAL DATA:  Dyspnea. EXAM: CT ANGIOGRAPHY CHEST WITH CONTRAST TECHNIQUE: Multidetector CT imaging of the chest was performed using the standard protocol during bolus administration of intravenous contrast. Multiplanar CT image reconstructions and MIPs were obtained to evaluate the vascular anatomy. RADIATION DOSE REDUCTION: This exam was performed according to the departmental dose-optimization program which includes automated exposure control, adjustment of the mA and/or kV according to patient size and/or use of iterative reconstruction technique. CONTRAST:  50mL OMNIPAQUE IOHEXOL 350 MG/ML SOLN COMPARISON:  January 26, 2017. FINDINGS: Cardiovascular: Moderate  cardiomegaly is noted. No pericardial effusion. Coronary artery calcifications are noted. Atherosclerosis of thoracic aorta is noted. 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 There is no evidence of large central pulmonary embolus seen in main pulmonary artery or proximal portions of left and right pulmonary arteries. There is limited opacification of the lower lobe branches bilaterally potentially due to timing of contrast bolus as well as surrounding atelectasis. Smaller and more peripheral pulmonary emboli these areas cannot be excluded. Mediastinum/Nodes: No enlarged mediastinal, hilar, or axillary lymph nodes. Thyroid  gland, trachea, and esophagus demonstrate no significant findings. Lungs/Pleura: No pneumothorax is noted. Moderate bilateral pleural effusions are noted with associated atelectasis of both lower lobes. Upper Abdomen: No acute abnormality. Musculoskeletal: No chest wall abnormality. No acute or significant osseous findings. Review of the MIP images confirms the above findings. IMPRESSION: No definite evidence of large central pulmonary embolus seen in main pulmonary artery or proximal portions of left and right pulmonary arteries.  However, there is very limited opacification of the lower lobe branches of both pulmonary arteries potentially due to timing of contrast bolus as well as surrounding atelectasis, and therefore smaller and more peripheral pulmonary emboli cannot be excluded on the basis of this exam. 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 Recommend annual imaging followup by CTA or MRA. This recommendation follows 2010 ACCF/AHA/AATS/ACR/ASA/SCA/SCAI/SIR/STS/SVM Guidelines for the Diagnosis and Management of Patients with Thoracic Aortic Disease. Circulation. 2010; 121: Z610-R604. Aortic aneurysm NOS (ICD10-I71.9) Moderate bilateral pleural effusions are noted with adjacent atelectasis of both lower lobes. Enlarged main pulmonary artery is noted suggesting pulmonary artery  hypertension. Moderate cardiomegaly. Coronary artery calcifications are noted suggesting coronary artery disease. Aortic Atherosclerosis (ICD10-I70.0). Electronically Signed   By: Rosalene Colon M.D.   On: 02/19/2024 14:05   DG Chest Port 1 View Result Date: 02/19/2024 CLINICAL DATA:  [id number] SOB (shortness of breath) [id number] EXAM: PORTABLE CHEST - 1 VIEW COMPARISON:  None available. FINDINGS: Retrocardiac airspace consolidation with blunting of the costophrenic sulcus. Blunting of the right costophrenic angle with patchy right basilar airspace opacities, also present. No pneumothorax. Skin fold artifact along the right lung. Mild cardiomegaly. Tortuous aorta with aortic atherosclerosis. No acute fracture or destructive lesions. Multilevel thoracic osteophytosis. Osteopenia. IMPRESSION: Small bilateral pleural effusions, larger on the left than the right, with bibasilar airspace opacities, likely atelectasis. Electronically Signed   By: Rance Burrows M.D.   On: 02/19/2024 10:23   EKG: Independently reviewed.  Sinus rhythm at 91 bpm.  Frequent PVCs.  Significant baseline wander.  Minimal baseline artifact.  Nonspecific T wave changes.  Assessment/Plan Principal Problem:   Acute respiratory failure with hypoxia (HCC) Active Problems:   UTI (urinary tract infection)   Pleural effusion due to CHF (congestive heart failure) (HCC)   Acute CHF (HCC)   Acute respite failure with hypoxia Pleural effusions Acute CHF? > No history of prior CHF.  Now with a week of worsening shortness of breath and requiring 2 L to maintain saturations. > Bilateral pleural effusions on CT with cardiomegaly.  proBNP at freestanding ED greater than 35,000.  Troponin T at freestanding ED flat at 288, 286. > Received Lasix at outside ED. - Monitor on progressive overnight - Continue with Lasix 40 mg IV twice daily - Echocardiogram - Strict I's and O's, daily weights - Check magnesium - Trend renal function and  electrolytes - IR consult for thoracentesis with labs  Urinary tract infection > Incidentally noted to have nitrates, leukocytes, bacteria in urine.  Started on ceftriaxone - Continue ceftriaxone - Follow-up urine culture - Trend fever curve WBC  History of PE > In 2018, no longer on anticoagulation.  Negative CTA though limited by contrast timing in the ED.  DVT prophylaxis: Heparin  Code Status:   Full Family Communication:      None on admission.  Attempted to reach patient's son by phone for an update but there was no answer.  Patient's son was with her in the freestanding ED however though.  Disposition Plan:   Patient is from:  Home  Anticipated DC to:  Home  Anticipated DC date:  1 to 4 days  Anticipated DC barriers: None  Consults called:  None Admission status:  Ovation, progressive  Severity of Illness: The appropriate patient status for this patient is OBSERVATION. Observation status is judged to be reasonable and necessary in order to provide the required intensity of service to ensure the patient's safety. The patient's presenting symptoms, physical exam findings, and initial  radiographic and laboratory data in the context of their medical condition is felt to place them at decreased risk for further clinical deterioration. Furthermore, it is anticipated that the patient will be medically stable for discharge from the hospital within 2 midnights of admission.    Johnetta Nab MD [facility]  How to contact the [facility] Attending or Consulting provider 7A - 7P or covering provider during after hours 7P -7A, for this patient?   Check the care team in [facility] and look for a) attending/consulting [facility] provider listed and b) the [facility] team listed Log into [email or web address] and use [place]'s universal password to access. If you do not have the password, please contact the hospital operator. Locate the [facility] provider you are looking for under [facility] and page to a  number that you can be directly reached. If you still have difficulty reaching the provider, please page the [facility] (Director on Call) for the Hospitalists listed on amion for assistance.  02/19/2024, 5:27 PM

## 2024-02-20 ENCOUNTER — Observation Stay (HOSPITAL_COMMUNITY)

## 2024-02-20 DIAGNOSIS — I5020 Unspecified systolic (congestive) heart failure: Secondary | ICD-10-CM | POA: Diagnosis not present

## 2024-02-20 DIAGNOSIS — R7989 Other specified abnormal findings of blood chemistry: Secondary | ICD-10-CM | POA: Diagnosis not present

## 2024-02-20 DIAGNOSIS — N200 Calculus of kidney: Secondary | ICD-10-CM | POA: Diagnosis not present

## 2024-02-20 DIAGNOSIS — E785 Hyperlipidemia, unspecified: Secondary | ICD-10-CM | POA: Diagnosis present

## 2024-02-20 DIAGNOSIS — I110 Hypertensive heart disease with heart failure: Secondary | ICD-10-CM | POA: Diagnosis not present

## 2024-02-20 DIAGNOSIS — I272 Pulmonary hypertension, unspecified: Secondary | ICD-10-CM | POA: Diagnosis present

## 2024-02-20 DIAGNOSIS — I5021 Acute systolic (congestive) heart failure: Secondary | ICD-10-CM

## 2024-02-20 DIAGNOSIS — I255 Ischemic cardiomyopathy: Secondary | ICD-10-CM | POA: Diagnosis present

## 2024-02-20 DIAGNOSIS — I5043 Acute on chronic combined systolic (congestive) and diastolic (congestive) heart failure: Secondary | ICD-10-CM | POA: Diagnosis not present

## 2024-02-20 DIAGNOSIS — R93421 Abnormal radiologic findings on diagnostic imaging of right kidney: Secondary | ICD-10-CM | POA: Diagnosis not present

## 2024-02-20 DIAGNOSIS — K761 Chronic passive congestion of liver: Secondary | ICD-10-CM | POA: Diagnosis present

## 2024-02-20 DIAGNOSIS — J9601 Acute respiratory failure with hypoxia: Secondary | ICD-10-CM | POA: Diagnosis not present

## 2024-02-20 DIAGNOSIS — Z1152 Encounter for screening for COVID-19: Secondary | ICD-10-CM | POA: Diagnosis not present

## 2024-02-20 DIAGNOSIS — E876 Hypokalemia: Secondary | ICD-10-CM | POA: Diagnosis present

## 2024-02-20 DIAGNOSIS — I7121 Aneurysm of the ascending aorta, without rupture: Secondary | ICD-10-CM | POA: Diagnosis present

## 2024-02-20 DIAGNOSIS — Z6829 Body mass index (BMI) 29.0-29.9, adult: Secondary | ICD-10-CM | POA: Diagnosis not present

## 2024-02-20 DIAGNOSIS — I493 Ventricular premature depolarization: Secondary | ICD-10-CM | POA: Diagnosis present

## 2024-02-20 DIAGNOSIS — N390 Urinary tract infection, site not specified: Secondary | ICD-10-CM | POA: Diagnosis present

## 2024-02-20 DIAGNOSIS — M7989 Other specified soft tissue disorders: Secondary | ICD-10-CM

## 2024-02-20 DIAGNOSIS — I509 Heart failure, unspecified: Secondary | ICD-10-CM | POA: Diagnosis not present

## 2024-02-20 DIAGNOSIS — I2489 Other forms of acute ischemic heart disease: Secondary | ICD-10-CM | POA: Diagnosis not present

## 2024-02-20 DIAGNOSIS — R7401 Elevation of levels of liver transaminase levels: Secondary | ICD-10-CM | POA: Diagnosis not present

## 2024-02-20 DIAGNOSIS — I5082 Biventricular heart failure: Secondary | ICD-10-CM | POA: Diagnosis present

## 2024-02-20 DIAGNOSIS — J90 Pleural effusion, not elsewhere classified: Secondary | ICD-10-CM | POA: Diagnosis not present

## 2024-02-20 DIAGNOSIS — Z79899 Other long term (current) drug therapy: Secondary | ICD-10-CM | POA: Diagnosis not present

## 2024-02-20 DIAGNOSIS — Z6831 Body mass index (BMI) 31.0-31.9, adult: Secondary | ICD-10-CM | POA: Diagnosis not present

## 2024-02-20 DIAGNOSIS — I471 Supraventricular tachycardia, unspecified: Secondary | ICD-10-CM | POA: Diagnosis not present

## 2024-02-20 DIAGNOSIS — I428 Other cardiomyopathies: Secondary | ICD-10-CM | POA: Diagnosis present

## 2024-02-20 DIAGNOSIS — E66811 Obesity, class 1: Secondary | ICD-10-CM | POA: Diagnosis present

## 2024-02-20 DIAGNOSIS — J918 Pleural effusion in other conditions classified elsewhere: Secondary | ICD-10-CM | POA: Diagnosis present

## 2024-02-20 DIAGNOSIS — I2510 Atherosclerotic heart disease of native coronary artery without angina pectoris: Secondary | ICD-10-CM | POA: Diagnosis present

## 2024-02-20 DIAGNOSIS — N179 Acute kidney failure, unspecified: Secondary | ICD-10-CM | POA: Diagnosis present

## 2024-02-20 DIAGNOSIS — N3000 Acute cystitis without hematuria: Secondary | ICD-10-CM | POA: Diagnosis not present

## 2024-02-20 DIAGNOSIS — K828 Other specified diseases of gallbladder: Secondary | ICD-10-CM | POA: Diagnosis not present

## 2024-02-20 DIAGNOSIS — I340 Nonrheumatic mitral (valve) insufficiency: Secondary | ICD-10-CM | POA: Diagnosis present

## 2024-02-20 LAB — ECHOCARDIOGRAM COMPLETE
AR max vel: 2.06 cm2
AV Area VTI: 2.51 cm2
AV Area mean vel: 2.24 cm2
AV Mean grad: 2 mmHg
AV Peak grad: 4.2 mmHg
Ao pk vel: 1.02 m/s
Area-P 1/2: 3.87 cm2
MV VTI: 1.3 cm2
S' Lateral: 4.9 cm
Weight: 3012.37 [oz_av]

## 2024-02-20 LAB — HEPATITIS PANEL, ACUTE
HCV Ab: NONREACTIVE
Hep A IgM: NONREACTIVE
Hep B C IgM: NONREACTIVE
Hepatitis B Surface Ag: NONREACTIVE

## 2024-02-20 LAB — COMPREHENSIVE METABOLIC PANEL WITH GFR
ALT: 377 U/L — ABNORMAL HIGH (ref 0–44)
AST: 150 U/L — ABNORMAL HIGH (ref 15–41)
Albumin: 2.9 g/dL — ABNORMAL LOW (ref 3.5–5.0)
Alkaline Phosphatase: 25 U/L — ABNORMAL LOW (ref 38–126)
Anion gap: 9 (ref 5–15)
BUN: 36 mg/dL — ABNORMAL HIGH (ref 8–23)
CO2: 26 mmol/L (ref 22–32)
Calcium: 8.7 mg/dL — ABNORMAL LOW (ref 8.9–10.3)
Chloride: 105 mmol/L (ref 98–111)
Creatinine, Ser: 1.68 mg/dL — ABNORMAL HIGH (ref 0.44–1.00)
GFR, Estimated: 30 mL/min — ABNORMAL LOW (ref >60)
Glucose, Bld: 106 mg/dL — ABNORMAL HIGH (ref 70–99)
Potassium: 3.5 mmol/L (ref 3.5–5.1)
Sodium: 140 mmol/L (ref 135–145)
Total Bilirubin: 0.9 mg/dL (ref 0.0–1.2)
Total Protein: 5.8 g/dL — ABNORMAL LOW (ref 6.5–8.1)

## 2024-02-20 LAB — CBC
HCT: 39.3 % (ref 36.0–46.0)
Hemoglobin: 12.7 g/dL (ref 12.0–15.0)
MCH: 30.1 pg (ref 26.0–34.0)
MCHC: 32.3 g/dL (ref 30.0–36.0)
MCV: 93.1 fL (ref 80.0–100.0)
Platelets: 201 10*3/uL (ref 150–400)
RBC: 4.22 MIL/uL (ref 3.87–5.11)
RDW: 14.1 % (ref 11.5–15.5)
WBC: 8.5 10*3/uL (ref 4.0–10.5)
nRBC: 0 % (ref 0.0–0.2)

## 2024-02-20 LAB — MAGNESIUM: Magnesium: 1.9 mg/dL (ref 1.7–2.4)

## 2024-02-20 LAB — LACTATE DEHYDROGENASE: LDH: 269 U/L — ABNORMAL HIGH (ref 98–192)

## 2024-02-20 MED ORDER — LIDOCAINE HCL 1 % IJ SOLN
20.0000 mL | Freq: Once | INTRAMUSCULAR | Status: AC
  Administered 2024-02-20: 10 mL via INTRADERMAL

## 2024-02-20 MED ORDER — LIDOCAINE HCL 1 % IJ SOLN
INTRAMUSCULAR | Status: AC
  Filled 2024-02-20: qty 20

## 2024-02-20 MED ORDER — PERFLUTREN LIPID MICROSPHERE
1.0000 mL | INTRAVENOUS | Status: AC | PRN
  Administered 2024-02-20: 2 mL via INTRAVENOUS

## 2024-02-20 NOTE — Progress Notes (Addendum)
 PROGRESS NOTE        PATIENT DETAILS Name: Regina Bell Age: 83 y.o. Sex: female Date of Birth: August 03, 1941 Admit Date: 02/19/2024 Admitting Physician Johnetta Nab, MD [id number], Albin Altes, MD  Brief Summary: Patient is a 83 y.o.  female with history of pulmonary embolism-presented with shortness of breath.  Significant events: 5/13>> admit to [facility]  Significant studies: 5/13>> CTA chest: No large PE-Limited study of lower lobe branches, 4.2 cm ascending aortic aneurysm.  Moderate B/L pleural effusions, enlarged main pulmonary artery.  Moderate cardiomegaly.  Coronary artery calcifications.  Significant microbiology data: 5/13>> COVID/influenza/RSV PCR: Negative  Procedures: None  Consults: None  Subjective: Feels somewhat better-sleeps on a recliner at baseline-no obvious orthopnea.  Denies any leg swelling.  Still able to work-perform routine physical activity.  About to go down for thoracocentesis.  Objective: Vitals: Blood pressure (!) 185/92, pulse 69, temperature 97.7 F (36.5 C), temperature source Oral, resp. rate (!) 22, weight 85.4 kg, SpO2 96%.   Exam: Gen Exam:Alert awake-not in any distress HEENT:atraumatic, normocephalic Chest: B/L clear to auscultation anteriorly CVS:S1S2 regular Abdomen:soft non tender, non distended Extremities: Trace edema Neurology: Non focal Skin: no rash  Pertinent Labs/Radiology:    Latest Ref Rng & Units 02/20/2024    4:28 AM 02/19/2024    9:33 AM 01/27/2017    2:59 AM  CBC  WBC 4.0 - 10.5 K/uL 8.5  9.2  11.1   Hemoglobin 12.0 - 15.0 g/dL 84.6  96.2  95.2   Hematocrit 36.0 - 46.0 % 39.3  41.2  40.6   Platelets 150 - 400 K/uL 201  209  194     Lab Results  Component Value Date   NA 140 02/20/2024   K 3.5 02/20/2024   CL 105 02/20/2024   CO2 26 02/20/2024      Assessment/Plan: Acute hypoxic respiratory failure Suspicion for new onset CHF (unclear whether diastolic heart  systolic)-however no excessive volume peripherally-although has elevated JVD.  Has prior history of PE-CTA negative for large PE-no longer on Xarelto  per history. Workup in process-echo/Doppler/thoracocentesis pending.  May end up needing a VQ scan. Cautiously continue with IV Lasix-follow renal function closely.  History of  PE 2018 No obvious central PE seen on CTA Check Dopplers of B/L extremities No longer on Xarelto  Per chart review-PE in 2018 was unprovoked.  Await workup-if there is any suspicion for an acute VTE episode-May need to be placed back on indefinite anticoagulation.   AKI Likely hemodynamically mediated-potentially cardiorenal syndrome if she does indeed have HFrEF. Creatinine essentially unchanged Volume status relatively stable Renal ultrasound pending  Follow electrolytes-avoid nephrotoxic agents (did get contrast with CTA 5/13)  Transaminitis Unclear etiology-unclear if this is from low flow state/CHF causing hepatic congestion No abdominal pain-benign abdominal exam Acute hepatitis serology/RUQ ultrasound pending Follow LFTs  Bilateral pleural effusion Seen incidentally on CTA chest Thoracocentesis scheduled for later today  UTI Some dysuria Rocephin-follow cultures.  4.2 cm ascending aortic aneurysm Incidental finding on CTA chest Radiology recommending annual follow-up by CTA or MRA.  Class I obesity: Estimated body mass index is 31.33 kg/m as calculated from the following:   Height as of 01/26/17: 5\' 5"  (1.651 m).   Weight as of this encounter: 85.4 kg.   Code status:   Code Status: Full Code   DVT Prophylaxis: heparin  injection 5,000 Units Start:  02/19/24 2200   Family Communication: None at bedside   Disposition Plan: Status is: Observation The patient will require care spanning > 2 midnights and should be moved to inpatient because: Severity of illness   Planned Discharge Destination:Home   Diet: Diet Order             Diet  Heart Room service appropriate? Yes; Fluid consistency: Thin; Fluid restriction: 1800 mL Fluid  Diet effective now                     Antimicrobial agents: Anti-infectives (From admission, onward)    Start     Dose/Rate Route Frequency Ordered Stop   02/20/24 1000  cefTRIAXone (ROCEPHIN) 1 g in sodium chloride  0.9 % 100 mL IVPB        1 g 200 mL/hr over 30 Minutes Intravenous Every 24 hours 02/19/24 1726     02/19/24 1100  cefTRIAXone (ROCEPHIN) 1 g in sodium chloride  0.9 % 100 mL IVPB        1 g 200 mL/hr over 30 Minutes Intravenous  Once 02/19/24 1045 02/19/24 1200        MEDICATIONS: Scheduled Meds:  furosemide  40 mg Intravenous BID   heparin   5,000 Units Subcutaneous Q8H   sodium chloride  flush  3 mL Intravenous Q12H   Continuous Infusions:  sodium chloride  Stopped (02/19/24 1416)   cefTRIAXone (ROCEPHIN)  IV     PRN Meds:.sodium chloride , acetaminophen  **OR** acetaminophen , polyethylene glycol   I have personally reviewed following labs and imaging studies  LABORATORY DATA: CBC: Recent Labs  Lab 02/19/24 0933 02/20/24 0428  WBC 9.2 8.5  NEUTROABS 7.3  --   HGB 13.4 12.7  HCT 41.2 39.3  MCV 92.8 93.1  PLT 209 201    Basic Metabolic Panel: Recent Labs  Lab 02/19/24 0933 02/20/24 0428  NA 141 140  K 4.2 3.5  CL 104 105  CO2 21* 26  GLUCOSE 126* 106*  BUN 39* 36*  CREATININE 1.68* 1.68*  CALCIUM 9.7 8.7*  MG  --  1.9    GFR: CrCl cannot be calculated (Unknown ideal weight.).  Liver Function Tests: Recent Labs  Lab 02/19/24 0933 02/20/24 0428  AST 218* 150*  ALT 493* 377*  ALKPHOS 43 25*  BILITOT 1.1 0.9  PROT 6.3* 5.8*  ALBUMIN 3.6 2.9*   No results for input(s): "LIPASE", "AMYLASE" in the last 168 hours. No results for input(s): "AMMONIA" in the last 168 hours.  Coagulation Profile: No results for input(s): "INR", "PROTIME" in the last 168 hours.  Cardiac Enzymes: No results for input(s): "CKTOTAL", "CKMB", "CKMBINDEX",  "TROPONINI" in the last 168 hours.  BNP (last 3 results) Recent Labs    02/19/24 0933  PROBNP >35,000.0*    Lipid Profile: No results for input(s): "CHOL", "HDL", "LDLCALC", "TRIG", "CHOLHDL", "LDLDIRECT" in the last 72 hours.  Thyroid  Function Tests: No results for input(s): "TSH", "T4TOTAL", "FREET4", "T3FREE", "THYROIDAB" in the last 72 hours.  Anemia Panel: No results for input(s): "VITAMINB12", "FOLATE", "FERRITIN", "TIBC", "IRON", "RETICCTPCT" in the last 72 hours.  Urine analysis:    Component Value Date/Time   COLORURINE YELLOW 02/19/2024 0933   APPEARANCEUR HAZY (A) 02/19/2024 0933   LABSPEC 1.008 02/19/2024 0933   PHURINE 7.5 02/19/2024 0933   GLUCOSEU NEGATIVE 02/19/2024 0933   HGBUR NEGATIVE 02/19/2024 0933   BILIRUBINUR NEGATIVE 02/19/2024 0933   KETONESUR NEGATIVE 02/19/2024 0933   PROTEINUR NEGATIVE 02/19/2024 0933   NITRITE POSITIVE (A) 02/19/2024 0933  LEUKOCYTESUR LARGE (A) 02/19/2024 0933    Sepsis Labs: Lactic Acid, Venous No results found for: "LATICACIDVEN"  MICROBIOLOGY: Recent Results (from the past 240 hours)  Resp panel by RT-PCR (RSV, Flu A&B, Covid) Anterior Nasal Swab     Status: None   Collection Time: 02/19/24  9:33 AM   Specimen: Anterior Nasal Swab  Result Value Ref Range Status   SARS Coronavirus 2 by RT PCR NEGATIVE NEGATIVE Final    Comment: (NOTE) SARS-CoV-2 target nucleic acids are NOT DETECTED.  The SARS-CoV-2 RNA is generally detectable in upper respiratory specimens during the acute phase of infection. The lowest concentration of SARS-CoV-2 viral copies this assay can detect is 138 copies/mL. A negative result does not preclude SARS-Cov-2 infection and should not be used as the sole basis for treatment or other patient management decisions. A negative result may occur with  improper specimen collection/handling, submission of specimen other than nasopharyngeal swab, presence of viral mutation(s) within the areas  targeted by this assay, and inadequate number of viral copies(<138 copies/mL). A negative result must be combined with clinical observations, patient history, and epidemiological information. The expected result is Negative.  Fact Sheet for Patients:  BloggerCourse.com  Fact Sheet for Healthcare Providers:  SeriousBroker.it  This test is no t yet approved or cleared by the [place]  FDA and  has been authorized for detection and/or diagnosis of SARS-CoV-2 by FDA under an Emergency Use Authorization (EUA). This EUA will remain  in effect (meaning this test can be used) for the duration of the COVID-19 declaration under Section 564(b)(1) of the Act, 21 U.S.C.section 360bbb-3(b)(1), unless the authorization is terminated  or revoked sooner.       Influenza A by PCR NEGATIVE NEGATIVE Final   Influenza B by PCR NEGATIVE NEGATIVE Final    Comment: (NOTE) The Xpert Xpress SARS-CoV-2/FLU/RSV plus assay is intended as an aid in the diagnosis of influenza from Nasopharyngeal swab specimens and should not be used as a sole basis for treatment. Nasal washings and aspirates are unacceptable for Xpert Xpress SARS-CoV-2/FLU/RSV testing.  Fact Sheet for Patients: BloggerCourse.com  Fact Sheet for Healthcare Providers: SeriousBroker.it  This test is not yet approved or cleared by the [place]  FDA and has been authorized for detection and/or diagnosis of SARS-CoV-2 by FDA under an Emergency Use Authorization (EUA). This EUA will remain in effect (meaning this test can be used) for the duration of the COVID-19 declaration under Section 564(b)(1) of the Act, 21 U.S.C. section 360bbb-3(b)(1), unless the authorization is terminated or revoked.     Resp Syncytial Virus by PCR NEGATIVE NEGATIVE Final    Comment: (NOTE) Fact Sheet for  Patients: BloggerCourse.com  Fact Sheet for Healthcare Providers: SeriousBroker.it  This test is not yet approved or cleared by the [place]  FDA and has been authorized for detection and/or diagnosis of SARS-CoV-2 by FDA under an Emergency Use Authorization (EUA). This EUA will remain in effect (meaning this test can be used) for the duration of the COVID-19 declaration under Section 564(b)(1) of the Act, 21 U.S.C. section 360bbb-3(b)(1), unless the authorization is terminated or revoked.  Performed at [facility], [address], [place], [place] [address]     RADIOLOGY STUDIES/RESULTS: DG Chest 1 View Result Date: 02/20/2024 CLINICAL DATA:  Attempted left thoracentesis. EXAM: CHEST  1 VIEW COMPARISON:  Feb 19, 2023. FINDINGS: No definite pneumothorax is noted. Bilateral pleural effusions are again noted, left greater than right. IMPRESSION: Bilateral pleural effusions as noted above. Electronically Signed  By: Rosalene Colon M.D.   On: 02/20/2024 09:13   CT Angio Chest PE W and/or Wo Contrast Result Date: 02/19/2024 CLINICAL DATA:  Dyspnea. EXAM: CT ANGIOGRAPHY CHEST WITH CONTRAST TECHNIQUE: Multidetector CT imaging of the chest was performed using the standard protocol during bolus administration of intravenous contrast. Multiplanar CT image reconstructions and MIPs were obtained to evaluate the vascular anatomy. RADIATION DOSE REDUCTION: This exam was performed according to the departmental dose-optimization program which includes automated exposure control, adjustment of the mA and/or kV according to patient size and/or use of iterative reconstruction technique. CONTRAST:  50mL OMNIPAQUE IOHEXOL 350 MG/ML SOLN COMPARISON:  January 26, 2017. FINDINGS: Cardiovascular: Moderate cardiomegaly is noted. No pericardial effusion. Coronary artery calcifications are noted. Atherosclerosis of thoracic aorta is noted. 4.2 cm  ascending thoracic aortic aneurysm is noted. There is no evidence of large central pulmonary embolus seen in main pulmonary artery or proximal portions of left and right pulmonary arteries. There is limited opacification of the lower lobe branches bilaterally potentially due to timing of contrast bolus as well as surrounding atelectasis. Smaller and more peripheral pulmonary emboli these areas cannot be excluded. Mediastinum/Nodes: No enlarged mediastinal, hilar, or axillary lymph nodes. Thyroid  gland, trachea, and esophagus demonstrate no significant findings. Lungs/Pleura: No pneumothorax is noted. Moderate bilateral pleural effusions are noted with associated atelectasis of both lower lobes. Upper Abdomen: No acute abnormality. Musculoskeletal: No chest wall abnormality. No acute or significant osseous findings. Review of the MIP images confirms the above findings. IMPRESSION: No definite evidence of large central pulmonary embolus seen in main pulmonary artery or proximal portions of left and right pulmonary arteries. However, there is very limited opacification of the lower lobe branches of both pulmonary arteries potentially due to timing of contrast bolus as well as surrounding atelectasis, and therefore smaller and more peripheral pulmonary emboli cannot be excluded on the basis of this exam. 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 Recommend annual imaging followup by CTA or MRA. This recommendation follows 2010 ACCF/AHA/AATS/ACR/ASA/SCA/SCAI/SIR/STS/SVM Guidelines for the Diagnosis and Management of Patients with Thoracic Aortic Disease. Circulation. 2010; 121: R604-V409. Aortic aneurysm NOS (ICD10-I71.9) Moderate bilateral pleural effusions are noted with adjacent atelectasis of both lower lobes. Enlarged main pulmonary artery is noted suggesting pulmonary artery hypertension. Moderate cardiomegaly. Coronary artery calcifications are noted suggesting coronary artery disease. Aortic Atherosclerosis  (ICD10-I70.0). Electronically Signed   By: Rosalene Colon M.D.   On: 02/19/2024 14:05   DG Chest Port 1 View Result Date: 02/19/2024 CLINICAL DATA:  [id number] SOB (shortness of breath) [id number] EXAM: PORTABLE CHEST - 1 VIEW COMPARISON:  None available. FINDINGS: Retrocardiac airspace consolidation with blunting of the costophrenic sulcus. Blunting of the right costophrenic angle with patchy right basilar airspace opacities, also present. No pneumothorax. Skin fold artifact along the right lung. Mild cardiomegaly. Tortuous aorta with aortic atherosclerosis. No acute fracture or destructive lesions. Multilevel thoracic osteophytosis. Osteopenia. IMPRESSION: Small bilateral pleural effusions, larger on the left than the right, with bibasilar airspace opacities, likely atelectasis. Electronically Signed   By: Rance Burrows M.D.   On: 02/19/2024 10:23     LOS: 0 days   Kimberly Penna, MD  [facility]    To contact the attending provider between 7A-7P or the covering provider during after hours 7P-7A, please log into the web site [email or web address] and access using universal [place] password for that web site. If you do not have the password, please call the hospital operator.  02/20/2024, 9:21 AM

## 2024-02-20 NOTE — Plan of Care (Addendum)
 Pt has rested quietly throughout the night with no distress noted. Alert and oriented. On O2 3LNC. No respiratory distress noted. SR on the monitor. Up to BR to void with cane and standby assist. No complaints voiced.     Problem: Education: Goal: Knowledge of General Education information will improve Description: Including pain rating scale, medication(s)/side effects and non-pharmacologic comfort measures Outcome: Progressing   Problem: Health Behavior/Discharge Planning: Goal: Ability to manage health-related needs will improve Outcome: Progressing   Problem: Clinical Measurements: Goal: Respiratory complications will improve Outcome: Progressing Goal: Cardiovascular complication will be avoided Outcome: Progressing   Problem: Coping: Goal: Level of anxiety will decrease Outcome: Progressing   Problem: Pain Managment: Goal: General experience of comfort will improve and/or be controlled Outcome: Progressing

## 2024-02-20 NOTE — Procedures (Signed)
 PROCEDURE SUMMARY:  Successful US  guided left thoracentesis. Yielded 0mL of clear, red fluid. Patient tolerated procedure well. No immediate complications. EBL = trace  Specimen was not sent for labs, no fluid collected.    Post procedure chest X-ray reveals no pneumothorax  Pasty Bongo PA-C 02/20/2024 8:49 AM

## 2024-02-21 ENCOUNTER — Other Ambulatory Visit (HOSPITAL_COMMUNITY): Payer: Self-pay

## 2024-02-21 ENCOUNTER — Other Ambulatory Visit: Payer: Self-pay | Admitting: Cardiology

## 2024-02-21 ENCOUNTER — Encounter (HOSPITAL_COMMUNITY): Payer: Self-pay | Admitting: Internal Medicine

## 2024-02-21 ENCOUNTER — Telehealth (HOSPITAL_COMMUNITY): Payer: Self-pay

## 2024-02-21 DIAGNOSIS — R7989 Other specified abnormal findings of blood chemistry: Secondary | ICD-10-CM

## 2024-02-21 DIAGNOSIS — I509 Heart failure, unspecified: Secondary | ICD-10-CM | POA: Diagnosis not present

## 2024-02-21 DIAGNOSIS — J9601 Acute respiratory failure with hypoxia: Secondary | ICD-10-CM | POA: Diagnosis not present

## 2024-02-21 DIAGNOSIS — N3000 Acute cystitis without hematuria: Secondary | ICD-10-CM | POA: Diagnosis not present

## 2024-02-21 LAB — CBC
HCT: 40.5 % (ref 36.0–46.0)
Hemoglobin: 13.3 g/dL (ref 12.0–15.0)
MCH: 30.4 pg (ref 26.0–34.0)
MCHC: 32.8 g/dL (ref 30.0–36.0)
MCV: 92.5 fL (ref 80.0–100.0)
Platelets: 202 10*3/uL (ref 150–400)
RBC: 4.38 MIL/uL (ref 3.87–5.11)
RDW: 13.8 % (ref 11.5–15.5)
WBC: 8.7 10*3/uL (ref 4.0–10.5)
nRBC: 0 % (ref 0.0–0.2)

## 2024-02-21 LAB — BASIC METABOLIC PANEL WITH GFR
Anion gap: 11 (ref 5–15)
BUN: 30 mg/dL — ABNORMAL HIGH (ref 8–23)
CO2: 31 mmol/L (ref 22–32)
Calcium: 9 mg/dL (ref 8.9–10.3)
Chloride: 99 mmol/L (ref 98–111)
Creatinine, Ser: 1.73 mg/dL — ABNORMAL HIGH (ref 0.44–1.00)
GFR, Estimated: 29 mL/min — ABNORMAL LOW (ref >60)
Glucose, Bld: 90 mg/dL (ref 70–99)
Potassium: 3.6 mmol/L (ref 3.5–5.1)
Sodium: 141 mmol/L (ref 135–145)

## 2024-02-21 LAB — HEPATIC FUNCTION PANEL
ALT: 403 U/L — ABNORMAL HIGH (ref 0–44)
AST: 171 U/L — ABNORMAL HIGH (ref 15–41)
Albumin: 3 g/dL — ABNORMAL LOW (ref 3.5–5.0)
Alkaline Phosphatase: 29 U/L — ABNORMAL LOW (ref 38–126)
Bilirubin, Direct: 0.2 mg/dL (ref 0.0–0.2)
Indirect Bilirubin: 0.9 mg/dL (ref 0.3–0.9)
Total Bilirubin: 1.1 mg/dL (ref 0.0–1.2)
Total Protein: 6 g/dL — ABNORMAL LOW (ref 6.5–8.1)

## 2024-02-21 MED ORDER — ADENOSINE 6 MG/2ML IV SOLN
INTRAVENOUS | Status: AC
Start: 2024-02-21 — End: 2024-02-21
  Administered 2024-02-21: 12 mg via INTRAVENOUS
  Filled 2024-02-21: qty 2

## 2024-02-21 MED ORDER — DAPAGLIFLOZIN PROPANEDIOL 10 MG PO TABS
10.0000 mg | ORAL_TABLET | Freq: Every day | ORAL | Status: DC
  Administered 2024-02-21 – 2024-02-24 (×4): 10 mg via ORAL
  Filled 2024-02-21 (×4): qty 1

## 2024-02-21 MED ORDER — ADENOSINE 6 MG/2ML IV SOLN
INTRAVENOUS | Status: AC
  Administered 2024-02-21: 6 mg via INTRAVENOUS
  Filled 2024-02-21: qty 2

## 2024-02-21 MED ORDER — ADENOSINE 6 MG/2ML IV SOLN
6.0000 mg | Freq: Once | INTRAVENOUS | Status: AC
Start: 2024-02-21 — End: 2024-02-21

## 2024-02-21 MED ORDER — FUROSEMIDE 40 MG PO TABS
40.0000 mg | ORAL_TABLET | Freq: Two times a day (BID) | ORAL | Status: DC
  Administered 2024-02-21: 40 mg via ORAL
  Filled 2024-02-21 (×3): qty 1

## 2024-02-21 MED ORDER — ISOSORBIDE MONONITRATE ER 30 MG PO TB24
30.0000 mg | ORAL_TABLET | Freq: Every day | ORAL | Status: DC
  Administered 2024-02-21 – 2024-02-22 (×2): 30 mg via ORAL
  Filled 2024-02-21 (×2): qty 1

## 2024-02-21 MED ORDER — ADENOSINE 6 MG/2ML IV SOLN: 12.0000 mg | Freq: Once | INTRAVENOUS | Status: AC

## 2024-02-21 MED ORDER — HYDRALAZINE HCL 25 MG PO TABS
25.0000 mg | ORAL_TABLET | Freq: Three times a day (TID) | ORAL | Status: DC
  Administered 2024-02-21 – 2024-02-22 (×3): 25 mg via ORAL
  Filled 2024-02-21 (×3): qty 1

## 2024-02-21 MED ORDER — POTASSIUM CHLORIDE CRYS ER 20 MEQ PO TBCR
40.0000 meq | EXTENDED_RELEASE_TABLET | Freq: Once | ORAL | Status: AC
  Administered 2024-02-21: 40 meq via ORAL
  Filled 2024-02-21: qty 2

## 2024-02-21 MED ORDER — MAGNESIUM SULFATE IN D5W 1-5 GM/100ML-% IV SOLN
1.0000 g | Freq: Once | INTRAVENOUS | Status: AC
  Administered 2024-02-21: 1 g via INTRAVENOUS
  Filled 2024-02-21: qty 100

## 2024-02-21 MED ORDER — METOPROLOL SUCCINATE ER 25 MG PO TB24
12.5000 mg | ORAL_TABLET | Freq: Every day | ORAL | Status: DC
  Administered 2024-02-21 – 2024-02-24 (×4): 12.5 mg via ORAL
  Filled 2024-02-21 (×4): qty 1

## 2024-02-21 NOTE — Progress Notes (Signed)
   02/21/24 1850  Assess: MEWS Score  BP 122/82  MAP (mmHg) 93  Pulse Rate (!) 161  ECG Heart Rate (!) 162  Resp (!) 22  SpO2 98 %  O2 Device Nasal Cannula  O2 Flow Rate (L/min) 2 L/min  Assess: MEWS Score  MEWS Temp 0  MEWS Systolic 0  MEWS Pulse 3  MEWS RR 1  MEWS LOC 0  MEWS Score 4  MEWS Score Color Red  Assess: if the MEWS score is Yellow or Red  Were vital signs accurate and taken at a resting state? Yes  Does the patient meet 2 or more of the SIRS criteria? No  MEWS guidelines implemented  Yes, red  Treat  MEWS Interventions Considered administering scheduled or prn medications/treatments as ordered  Take Vital Signs  Increase Vital Sign Frequency  Red: Q1hr x2, continue Q4hrs until patient remains green for 12hrs  Escalate  MEWS: Escalate Red: Discuss with charge nurse and notify provider. Consider notifying RRT. If remains red for 2 hours consider need for higher level of care  Notify: Charge Nurse/RN  Name of Charge Nurse/RN Notified Erin, RN  Provider Notification  Provider Name/Title Dr. Katharine Paling  Date Provider Notified 02/21/24  Time Provider Notified 1850  Method of Notification Page  Notification Reason New onset of dysrhythmia  Provider response Other (Comment);En route (Cardiology in route to unit)  Date of Provider Response 02/21/24  Time of Provider Response 1850  Notify: Rapid Response  Name of Rapid Response RN Notified Jenette Mitchell, RN  Date Rapid Response Notified 02/21/24  Time Rapid Response Notified 1850  Assess: SIRS CRITERIA  SIRS Temperature  0  SIRS Respirations  1  SIRS Pulse 1  SIRS WBC 0  SIRS Score Sum  2

## 2024-02-21 NOTE — Evaluation (Signed)
 Physical Therapy Evaluation Patient Details Name: Regina Bell MRN: [id number] DOB: 1941-02-24 Today's Date: 02/21/2024  History of Present Illness  83 y.o.  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 PMH of pulmonary embolism.  Clinical Impression  Pt admitted with above diagnosis. Previously independent, works at [employer], lives alone in a town home with 2 steps to enter through the garage. Able to transfer and ambulate with supervision, demonstrating some mild instability but self corrects . Declined to use her personal SPC during evaluation but after assessment we discussed need for use use to instability and weakness - pt agreeable, and also open to OPPT follow-up. Navigates >150 feet on unit on room air, sats 88-91% when consistent pleth obtained. 1/3 dyspnea noted, with no subjective SOB reported.  She does feel weaker than baseline. Up in chair with alarm placed, RN in room to assess. Back on 1.5L supplemental O2 at rest 95% SpO2. Pt currently with functional limitations due to the deficits listed below (see PT Problem List). Pt will benefit from acute skilled PT to increase their independence and safety with mobility to allow discharge.           If plan is discharge home, recommend the following: Assistance with cooking/housework;Assist for transportation;Help with stairs or ramp for entrance   Can travel by private vehicle        Equipment Recommendations None recommended by PT  Recommendations for Other Services       Functional Status Assessment Patient has had a recent decline in their functional status and demonstrates the ability to make significant improvements in function in a reasonable and predictable amount of time.     Precautions / Restrictions Precautions Precautions: Fall Recall of Precautions/Restrictions: Intact Restrictions Weight Bearing Restrictions Per Provider Order: No      Mobility  Bed Mobility Overal  bed mobility: Modified Independent             General bed mobility comments: no assist, extra time    Transfers Overall transfer level: Needs assistance Equipment used: None Transfers: Sit to/from Stand Sit to Stand: Supervision           General transfer comment: Declines to use her personal SPC. Able to stand with supervision for safety. Mild sway but self corrects without UE support. Encouraged SPC use at d/c for safety.    Ambulation/Gait Ambulation/Gait assistance: Supervision Gait Distance (Feet): 185 Feet Assistive device: None Gait Pattern/deviations: Step-through pattern, Decreased stride length, Staggering left, Antalgic Gait velocity: dec Gait velocity interpretation: <1.8 ft/sec, indicate of risk for recurrent falls   General Gait Details: Minor instability, declined to use her SPC. One instance of staggering but able to self correct early in distance. Gradully improved mechanics with distance but still slower, guarded, and with mild unsteadiness. Encouraged to use SPC at d/c for safety. SpO2 on RA ranged 88-91% when good pleth obtained. Mild dyspnea. No subjective SOB.  Stairs            Wheelchair Mobility     Tilt Bed    Modified Rankin (Stroke Patients Only)       Balance Overall balance assessment: Needs assistance Sitting-balance support: No upper extremity supported, Feet supported Sitting balance-Leahy Scale: Normal     Standing balance support: No upper extremity supported Standing balance-Leahy Scale: Fair  Pertinent Vitals/Pain Pain Assessment Pain Assessment: No/denies pain    Home Living Family/patient expects to be discharged to:: Private residence Living Arrangements: Alone Available Help at Discharge: Family;Available PRN/intermittently Type of Home: House Home Access: Stairs to enter Entrance Stairs-Rails: None Entrance Stairs-Number of Steps: 2   Home Layout: Two  level;Able to live on main level with bedroom/bathroom Home Equipment: Jeananne Mighty - single point;Shower seat;Grab bars - tub/shower      Prior Function                 ADLs Comments: Works 6hr days at [employer]     Extremity/Trunk Assessment   Upper Extremity Assessment Upper Extremity Assessment: Defer to OT evaluation    Lower Extremity Assessment Lower Extremity Assessment: Generalized weakness       Communication   Communication Communication: No apparent difficulties    Cognition Arousal: Alert Behavior During Therapy: WFL for tasks assessed/performed   PT - Cognitive impairments: No apparent impairments                         Following commands: Intact       Cueing Cueing Techniques: Verbal cues     General Comments General comments (skin integrity, edema, etc.): SpO2 at rest on 1.5 L 95%, 90% on RA, 88-91% ambulating on RA.    Exercises     Assessment/Plan    PT Assessment Patient needs continued PT services  PT Problem List Decreased strength;Decreased activity tolerance;Decreased balance;Decreased mobility;Cardiopulmonary status limiting activity       PT Treatment Interventions Gait training;DME instruction;Stair training;Functional mobility training;Therapeutic activities;Therapeutic exercise;Balance training;Neuromuscular re-education;Patient/family education    PT Goals (Current goals can be found in the Care Plan section)  Acute Rehab PT Goals Patient Stated Goal: Go home. Agreeable to OPPT PT Goal Formulation: With patient Time For Goal Achievement: 03/06/24 Potential to Achieve Goals: Good    Frequency Min 2X/week     Co-evaluation               AM-PAC PT "6 Clicks" Mobility  Outcome Measure Help needed turning from your back to your side while in a flat bed without using bedrails?: None Help needed moving from lying on your back to sitting on the side of a flat bed without using bedrails?: None Help needed  moving to and from a bed to a chair (including a wheelchair)?: A Little Help needed standing up from a chair using your arms (e.g., wheelchair or bedside chair)?: A Little Help needed to walk in hospital room?: A Little Help needed climbing 3-5 steps with a railing? : A Little 6 Click Score: 20    End of Session Equipment Utilized During Treatment: Gait belt Activity Tolerance: Patient tolerated treatment well Patient left: in chair;with call bell/phone within reach;with chair alarm set;with family/visitor present Nurse Communication: Mobility status (sats) PT Visit Diagnosis: Unsteadiness on feet (R26.81);Other abnormalities of gait and mobility (R26.89);Muscle weakness (generalized) (M62.81)    Time: 1610-9604 PT Time Calculation (min) (ACUTE ONLY): 30 min   Charges:   PT Evaluation $PT Eval Low Complexity: 1 Low PT Treatments $Gait Training: 8-22 mins PT General Charges $$ ACUTE PT VISIT: 1 Visit         Jory Ng, PT, DPT [facility]  Rehabilitation Services Physical Therapist Office: [phone] Website: [email or web address]   Alinda Irani 02/21/2024, 12:58 PM

## 2024-02-21 NOTE — Consult Note (Signed)
 Cardiology Consultation   Patient ID: Regina Bell MRN: [id number]; DOB: 1940-12-03  Admit date: 02/19/2024 Date of Consult: 02/21/2024  PCP:  Regina Blunt, MD   [facility] Providers Cardiologist:  None    Patient Profile:   Regina Bell is a 83 y.o. female with a hx of PE in 2018 no longer on anticoagulation who is being seen 02/21/2024 for the evaluation of CHF at the request of Dr. Hilton Bell.  History of Present Illness:   Regina Bell is an 83 year old female with above medical history.  Per chart review, patient does not have any cardiac history and does not follow with a cardiologist.  Patient presented to the ED at [facility] on 5/13 complaining of shortness of breath.  Oxygen saturation at rest was 87% on room air and she was started on 2 L via nasal cannula.  In the ED, pro BNP elevated to>35,000.  High-sensitivity troponin 288, 286.  Creatinine 1.68, K4.2, AST 218 ALT 493.  CBC within normal limits.  COVID, flu, RSV negative.  UA consistent with UTI.  Chest x-ray showed small bilateral pleural effusions, larger on left than right.  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  Patient was transferred to [facility] and was admitted to the internal medicine service for treatment of acute respiratory failure, possible new CHF.  Was started on IV Lasix.  Also started on ceftriaxone for UTI.  Echocardiogram on 5/14 showed EF 20-25%, moderate LVH, grade 3 diastolic dysfunction, moderately reduced RV systolic function, normal PA systolic pressure, mild-moderate mitral valve regurgitation. Cardiology consulted for new CHF.   On interview, patient denies having any past cardiac history.  She denies any family cardiac history.  In the past she had to take medicines for her blood  pressure and cholesterol.  However, she has not been seen by primary care provider in over a year.  Denies diabetes.  She has a remote tobacco use history and quit smoking cigarettes in 1997.  She has been overall fairly healthy.  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  She decided to come to the ED on 5/13 because of worsening shortness of breath.  Reports that her shortness of breath started a week prior and had progressed since that time.  Shortness of breath was mostly present on exertion and she denies having shortness of breath at rest.  Denies orthopnea.  Denies chest pain, palpitations, cough, lower extremity swelling, noticeable weight gain.  She has had a decreased appetite and low energy.  Denies recent infection/illness.  Denies fever, nausea, vomiting, nasal congestion.  Denies palpitations.  She has been given IV Lasix and reports good urine output  History reviewed. No pertinent past medical history.  Past Surgical History:  Procedure Laterality Date   IR THORACENTESIS ASP PLEURAL SPACE W/IMG GUIDE  02/20/2024     Home Medications:  Prior to Admission medications   Medication Sig Start Date End Date Taking? Authorizing Provider  OVER THE COUNTER MEDICATION Take 2 tablets by mouth every morning. *otc called blood flow*   Yes [provider]  benzonatate  (TESSALON ) 100 MG capsule Take 1 capsule (100 mg total) by mouth every 8 (eight) hours. Patient not taking: Reported on 02/19/2024 12/30/21   Billie Budge, MD  Rivaroxaban  (XARELTO ) 15 MG TABS tablet Take 1  tablet (15 mg total) by mouth 2 (two) times daily with a meal. Patient not taking: Reported on 02/19/2024 01/27/17   Rosanne Commodore, MD    Inpatient Medications: Scheduled Meds:  furosemide  40 mg Intravenous BID   heparin   5,000 Units Subcutaneous Q8H   sodium chloride  flush  3 mL Intravenous Q12H   Continuous Infusions:  cefTRIAXone (ROCEPHIN)  IV 1 g (02/21/24 0825)   PRN  Meds: acetaminophen  **OR** acetaminophen , polyethylene glycol  Allergies:   No Known Allergies  Social History:   Social History   Socioeconomic History   Marital status: Married    Spouse name: Not on file   Number of children: Not on file   Years of education: Not on file   Highest education level: Not on file  Occupational History   Not on file  Tobacco Use   Smoking status: Former   Smokeless tobacco: Never  Substance and Sexual Activity   Alcohol use: No   Drug use: No   Sexual activity: Not on file  Other Topics Concern   Not on file  Social History Narrative   ** Merged History Encounter **       Social Drivers of Health   Financial Resource Strain: Not on file  Food Insecurity: No Food Insecurity (02/20/2024)   Hunger Vital Sign    Worried About Running Out of Food in the Last Year: Never true    Ran Out of Food in the Last Year: Never true  Transportation Needs: No Transportation Needs (02/20/2024)   PRAPARE - Administrator, Civil Service (Medical): No    Lack of Transportation (Non-Medical): No  Physical Activity: Not on file  Stress: Not on file  Social Connections: Socially Integrated (02/21/2024)   Social Connection and Isolation Panel [NHANES]    Frequency of Communication with Friends and Family: Three times a week    Frequency of Social Gatherings with Friends and Family: Three times a week    Attends Religious Services: More than 4 times per year    Active Member of Clubs or Organizations: Yes    Attends Banker Meetings: More than 4 times per year    Marital Status: Married  Catering manager Violence: Not At Risk (02/20/2024)   Humiliation, Afraid, Rape, and Kick questionnaire    Fear of Current or Ex-Partner: No    Emotionally Abused: No    Physically Abused: No    Sexually Abused: No    Family History:   History reviewed. No pertinent family history.   ROS:  Please see the history of present illness.   All other  ROS reviewed and negative.     Physical Exam/Data:   Vitals:   02/21/24 0621 02/21/24 0700 02/21/24 0755 02/21/24 0800  BP:   (!) 178/75   Pulse:  (!) 58 71 71  Resp:  16 (!) 26 19  Temp:   (!) 97.4 F (36.3 C)   TempSrc:   Oral   SpO2:  92% 93% 93%  Weight: 81.7 kg     Height:        Intake/Output Summary (Last 24 hours) at 02/21/2024 1042 Last data filed at 02/21/2024 0622 Gross per 24 hour  Intake 340 ml  Output --  Net 340 ml      02/21/2024    6:21 AM 02/20/2024    5:00 AM 01/26/2017    5:27 PM  Last 3 Weights  Weight (lbs) 180 lb 1.9 oz 188 lb 4.4  oz 228 lb  Weight (kg) 81.7 kg 85.4 kg 103.42 kg     Body mass index is 29.97 kg/m.  General:  Well nourished, well developed, in no acute distress.  Comfortably in the bed with head elevated HEENT: normal Neck: no JVD Vascular: Radial pulses 2+ bilaterally Cardiac:  normal S1, S2; RRR with occasional skipped beats.  Faint systolic murmur Lungs: Diminished breath sounds in bilateral lung bases, otherwise clear to auscultation.  Normal work of breathing on 1 L via [place]  Abd: soft, nontender  Ext: no edema in BLE  Musculoskeletal:  No deformities  Skin: warm and dry  Neuro:   no focal abnormalities noted Psych:  Normal affect   EKG:  The EKG was personally reviewed and demonstrates:  Sinus rhythm with ventricular trigeminy, heart rate 91 bpm Telemetry:  Telemetry was personally reviewed and demonstrates: Sinus rhythm with PVCs  Relevant CV Studies: Cardiac Studies & Procedures   ______________________________________________________________________________________________     ECHOCARDIOGRAM  ECHOCARDIOGRAM COMPLETE 02/20/2024  Narrative ECHOCARDIOGRAM REPORT    Patient Name:   Regina Bell Munro Date of Exam: 02/20/2024 Medical Rec #:  [id number]         Height:       65.0 in Accession #:    [id number]        Weight:       188.3 lb Date of Birth:  1941/05/24          BSA:          1.928 m Patient Age:    83  years          BP:           185/92 mmHg Patient Gender: F                 HR:           80 bpm. Exam Location:  Inpatient  Procedure: 2D Echo, Cardiac Doppler, Color Doppler and Intracardiac Opacification Agent (Both Spectral and Color Flow Doppler were utilized during procedure).  Indications:    CHF  History:        Patient has no prior history of Echocardiogram examinations. CHF.  Sonographer:    Astrid Blamer Referring Phys: [id number] Gayl Katos MELVIN  IMPRESSIONS   1. Left ventricular ejection fraction, by estimation, is 20 to 25%. The left ventricle has severely decreased function. The left ventricle demonstrates global hypokinesis. There is moderate concentric left ventricular hypertrophy. Left ventricular diastolic parameters are consistent with Grade III diastolic dysfunction (restrictive). 2. Right ventricular systolic function is moderately reduced. The right ventricular size is normal. There is normal pulmonary artery systolic pressure. The estimated right ventricular systolic pressure is 33.3 mmHg. 3. Left atrial size was severely dilated. 4. Right atrial size was mildly dilated. 5. Moderate pleural effusion in the left lateral region. 6. The mitral valve is degenerative. Mild to moderate mitral valve regurgitation. Mild mitral stenosis. 7. The aortic valve is tricuspid. There is mild calcification of the aortic valve. Aortic valve regurgitation is mild. Aortic valve sclerosis/calcification is present, without any evidence of aortic stenosis. 8. The inferior vena cava is normal in size with greater than 50% respiratory variability, suggesting right atrial pressure of 3 mmHg.  Conclusion(s)/Recommendation(s): Consider evalaution for infiltrative cardiomopathy, if clinically indicated.  FINDINGS Left Ventricle: Left ventricular ejection fraction, by estimation, is 20 to 25%. The left ventricle has severely decreased function. The left ventricle demonstrates global  hypokinesis. Definity contrast agent was given IV to delineate the left  ventricular endocardial borders. The left ventricular internal cavity size was normal in size. There is moderate concentric left ventricular hypertrophy. Left ventricular diastolic parameters are consistent with Grade III diastolic dysfunction (restrictive).  Right Ventricle: The right ventricular size is normal. No increase in right ventricular wall thickness. Right ventricular systolic function is moderately reduced. There is normal pulmonary artery systolic pressure. The tricuspid regurgitant velocity is 2.66 m/s, and with an assumed right atrial pressure of 5 mmHg, the estimated right ventricular systolic pressure is 33.3 mmHg.  Left Atrium: Left atrial size was severely dilated.  Right Atrium: Right atrial size was mildly dilated.  Pericardium: There is no evidence of pericardial effusion.  Mitral Valve: The mitral valve is degenerative in appearance. There is moderate calcification of the mitral valve leaflet(s). Mild to moderate mitral valve regurgitation. Mild mitral valve stenosis. MV peak gradient, 6.7 mmHg. The mean mitral valve gradient is 3.0 mmHg.  Tricuspid Valve: The tricuspid valve is normal in structure. Tricuspid valve regurgitation is mild . No evidence of tricuspid stenosis.  Aortic Valve: The aortic valve is tricuspid. There is mild calcification of the aortic valve. Aortic valve regurgitation is mild. Aortic valve sclerosis/calcification is present, without any evidence of aortic stenosis. Aortic valve mean gradient measures 2.0 mmHg. Aortic valve peak gradient measures 4.2 mmHg. Aortic valve area, by VTI measures 2.51 cm.  Pulmonic Valve: The pulmonic valve was normal in structure. Pulmonic valve regurgitation is mild. No evidence of pulmonic stenosis.  Aorta: The aortic root is normal in size and structure.  Venous: The inferior vena cava is normal in size with greater than 50% respiratory  variability, suggesting right atrial pressure of 3 mmHg.  IAS/Shunts: No atrial level shunt detected by color flow Doppler.  Additional Comments: There is a moderate pleural effusion in the left lateral region.   LEFT VENTRICLE PLAX 2D LVIDd:         5.40 cm   Diastology LVIDs:         4.90 cm   LV e' medial:    2.28 cm/s LV PW:         1.20 cm   LV E/e' medial:  60.1 LV IVS:        1.00 cm   LV e' lateral:   4.90 cm/s LVOT diam:     1.90 cm   LV E/e' lateral: 28.0 LV SV:         38 LV SV Index:   20 LVOT Area:     2.84 cm   RIGHT VENTRICLE RV S prime:     9.79 cm/s TAPSE (M-mode): 1.3 cm  LEFT ATRIUM              Index        RIGHT ATRIUM           Index LA Vol (A2C):   90.2 ml  46.78 ml/m  RA Area:     19.20 cm LA Vol (A4C):   129.0 ml 66.91 ml/m  RA Volume:   43.30 ml  22.46 ml/m LA Biplane Vol: 109.0 ml 56.54 ml/m AORTIC VALVE AV Area (Vmax):    2.06 cm AV Area (Vmean):   2.24 cm AV Area (VTI):     2.51 cm AV Vmax:           102.00 cm/s AV Vmean:          64.000 cm/s AV VTI:            0.150 m AV Peak  Grad:      4.2 mmHg AV Mean Grad:      2.0 mmHg LVOT Vmax:         74.10 cm/s LVOT Vmean:        50.600 cm/s LVOT VTI:          0.133 m LVOT/AV VTI ratio: 0.89  AORTA Ao Root diam: 3.40 cm  MITRAL VALVE                TRICUSPID VALVE MV Area (PHT): 3.87 cm     TR Peak grad:   28.3 mmHg MV Area VTI:   1.30 cm     TR Vmax:        266.00 cm/s MV Peak grad:  6.7 mmHg MV Mean grad:  3.0 mmHg     SHUNTS MV Vmax:       1.29 m/s     Systemic VTI:  0.13 m MV Vmean:      75.8 cm/s    Systemic Diam: 1.90 cm MV Decel Time: 196 msec MV E velocity: 137.00 cm/s MV A velocity: 77.40 cm/s MV E/A ratio:  1.77  Jules Oar MD Electronically signed by Jules Oar MD Signature Date/Time: 02/20/2024/6:47:21 PM    Final          ______________________________________________________________________________________________       Laboratory  Data:  High Sensitivity Troponin:  No results for input(s): "TROPONINIHS" in the last 720 hours.   Chemistry Recent Labs  Lab 02/19/24 0933 02/20/24 0428 02/21/24 0435  NA 141 140 141  K 4.2 3.5 3.6  CL 104 105 99  CO2 21* 26 31  GLUCOSE 126* 106* 90  BUN 39* 36* 30*  CREATININE 1.68* 1.68* 1.73*  CALCIUM 9.7 8.7* 9.0  MG  --  1.9  --   GFRNONAA 30* 30* 29*  ANIONGAP 17* 9 11    Recent Labs  Lab 02/19/24 0933 02/20/24 0428 02/21/24 0435  PROT 6.3* 5.8* 6.0*  ALBUMIN 3.6 2.9* 3.0*  AST 218* 150* 171*  ALT 493* 377* 403*  ALKPHOS 43 25* 29*  BILITOT 1.1 0.9 1.1   Lipids No results for input(s): "CHOL", "TRIG", "HDL", "LABVLDL", "LDLCALC", "CHOLHDL" in the last 168 hours.  Hematology Recent Labs  Lab 02/19/24 0933 02/20/24 0428 02/21/24 0435  WBC 9.2 8.5 8.7  RBC 4.44 4.22 4.38  HGB 13.4 12.7 13.3  HCT 41.2 39.3 40.5  MCV 92.8 93.1 92.5  MCH 30.2 30.1 30.4  MCHC 32.5 32.3 32.8  RDW 14.1 14.1 13.8  PLT 209 201 202   Thyroid  No results for input(s): "TSH", "FREET4" in the last 168 hours.  BNP Recent Labs  Lab 02/19/24 0933  PROBNP >35,000.0*    DDimer  Recent Labs  Lab 02/19/24 0932  DDIMER 8.48*     Radiology/Studies:  ECHOCARDIOGRAM COMPLETE Result Date: 02/20/2024    ECHOCARDIOGRAM REPORT   Patient Name:   Regina Bell Date of Exam: 02/20/2024 Medical Rec #:  [id number]         Height:       65.0 in Accession #:    [id number]        Weight:       188.3 lb Date of Birth:  June 17, 1941          BSA:          1.928 m Patient Age:    83 years          BP:  185/92 mmHg Patient Gender: F                 HR:           80 bpm. Exam Location:  Inpatient Procedure: 2D Echo, Cardiac Doppler, Color Doppler and Intracardiac            Opacification Agent (Both Spectral and Color Flow Doppler were            utilized during procedure). Indications:    CHF  History:        Patient has no prior history of Echocardiogram examinations.                 CHF.   Sonographer:    Astrid Blamer Referring Phys: [id number] Gayl Katos MELVIN IMPRESSIONS  1. Left ventricular ejection fraction, by estimation, is 20 to 25%. The left ventricle has severely decreased function. The left ventricle demonstrates global hypokinesis. There is moderate concentric left ventricular hypertrophy. Left ventricular diastolic parameters are consistent with Grade III diastolic dysfunction (restrictive).  2. Right ventricular systolic function is moderately reduced. The right ventricular size is normal. There is normal pulmonary artery systolic pressure. The estimated right ventricular systolic pressure is 33.3 mmHg.  3. Left atrial size was severely dilated.  4. Right atrial size was mildly dilated.  5. Moderate pleural effusion in the left lateral region.  6. The mitral valve is degenerative. Mild to moderate mitral valve regurgitation. Mild mitral stenosis.  7. The aortic valve is tricuspid. There is mild calcification of the aortic valve. Aortic valve regurgitation is mild. Aortic valve sclerosis/calcification is present, without any evidence of aortic stenosis.  8. The inferior vena cava is normal in size with greater than 50% respiratory variability, suggesting right atrial pressure of 3 mmHg. Conclusion(s)/Recommendation(s): Consider evalaution for infiltrative cardiomopathy, if clinically indicated. FINDINGS  Left Ventricle: Left ventricular ejection fraction, by estimation, is 20 to 25%. The left ventricle has severely decreased function. The left ventricle demonstrates global hypokinesis. Definity contrast agent was given IV to delineate the left ventricular endocardial borders. The left ventricular internal cavity size was normal in size. There is moderate concentric left ventricular hypertrophy. Left ventricular diastolic parameters are consistent with Grade III diastolic dysfunction (restrictive). Right Ventricle: The right ventricular size is normal. No increase in right ventricular wall  thickness. Right ventricular systolic function is moderately reduced. There is normal pulmonary artery systolic pressure. The tricuspid regurgitant velocity is 2.66 m/s, and with an assumed right atrial pressure of 5 mmHg, the estimated right ventricular systolic pressure is 33.3 mmHg. Left Atrium: Left atrial size was severely dilated. Right Atrium: Right atrial size was mildly dilated. Pericardium: There is no evidence of pericardial effusion. Mitral Valve: The mitral valve is degenerative in appearance. There is moderate calcification of the mitral valve leaflet(s). Mild to moderate mitral valve regurgitation. Mild mitral valve stenosis. MV peak gradient, 6.7 mmHg. The mean mitral valve gradient is 3.0 mmHg. Tricuspid Valve: The tricuspid valve is normal in structure. Tricuspid valve regurgitation is mild . No evidence of tricuspid stenosis. Aortic Valve: The aortic valve is tricuspid. There is mild calcification of the aortic valve. Aortic valve regurgitation is mild. Aortic valve sclerosis/calcification is present, without any evidence of aortic stenosis. Aortic valve mean gradient measures 2.0 mmHg. Aortic valve peak gradient measures 4.2 mmHg. Aortic valve area, by VTI measures 2.51 cm. Pulmonic Valve: The pulmonic valve was normal in structure. Pulmonic valve regurgitation is mild. No evidence of pulmonic stenosis. Aorta: The aortic  root is normal in size and structure. Venous: The inferior vena cava is normal in size with greater than 50% respiratory variability, suggesting right atrial pressure of 3 mmHg. IAS/Shunts: No atrial level shunt detected by color flow Doppler. Additional Comments: There is a moderate pleural effusion in the left lateral region.  LEFT VENTRICLE PLAX 2D LVIDd:         5.40 cm   Diastology LVIDs:         4.90 cm   LV e' medial:    2.28 cm/s LV PW:         1.20 cm   LV E/e' medial:  60.1 LV IVS:        1.00 cm   LV e' lateral:   4.90 cm/s LVOT diam:     1.90 cm   LV E/e' lateral:  28.0 LV SV:         38 LV SV Index:   20 LVOT Area:     2.84 cm  RIGHT VENTRICLE RV S prime:     9.79 cm/s TAPSE (M-mode): 1.3 cm LEFT ATRIUM              Index        RIGHT ATRIUM           Index LA Vol (A2C):   90.2 ml  46.78 ml/m  RA Area:     19.20 cm LA Vol (A4C):   129.0 ml 66.91 ml/m  RA Volume:   43.30 ml  22.46 ml/m LA Biplane Vol: 109.0 ml 56.54 ml/m  AORTIC VALVE AV Area (Vmax):    2.06 cm AV Area (Vmean):   2.24 cm AV Area (VTI):     2.51 cm AV Vmax:           102.00 cm/s AV Vmean:          64.000 cm/s AV VTI:            0.150 m AV Peak Grad:      4.2 mmHg AV Mean Grad:      2.0 mmHg LVOT Vmax:         74.10 cm/s LVOT Vmean:        50.600 cm/s LVOT VTI:          0.133 m LVOT/AV VTI ratio: 0.89  AORTA Ao Root diam: 3.40 cm MITRAL VALVE                TRICUSPID VALVE MV Area (PHT): 3.87 cm     TR Peak grad:   28.3 mmHg MV Area VTI:   1.30 cm     TR Vmax:        266.00 cm/s MV Peak grad:  6.7 mmHg MV Mean grad:  3.0 mmHg     SHUNTS MV Vmax:       1.29 m/s     Systemic VTI:  0.13 m MV Vmean:      75.8 cm/s    Systemic Diam: 1.90 cm MV Decel Time: 196 msec MV E velocity: 137.00 cm/s MV A velocity: 77.40 cm/s MV E/A ratio:  1.77 Jules Oar MD Electronically signed by Jules Oar MD Signature Date/Time: 02/20/2024/6:47:21 PM    Final    VAS US  LOWER EXTREMITY VENOUS (DVT) Result Date: 02/20/2024  Lower Venous DVT Study Patient Name:  Regina Bell  Date of Exam:   02/20/2024 Medical Rec #: [id number]          Accession #:    [id number] Date  of Birth: [birth year]           Patient Gender: F Patient Age:   67 years Exam [facility]  [facility] Procedure:      VAS US  LOWER EXTREMITY VENOUS (DVT) Referring Phys: Kimberly Penna --------------------------------------------------------------------------------  Indications: Pulmonary embolism, and SOB.  Risk Factors: Confirmed PE. Comparison Study: No priors. Performing Technologist: Franky Ivanoff Sturdivant-Jones RDMS, RVT  Examination  Guidelines: A complete evaluation includes B-mode imaging, spectral Doppler, color Doppler, and power Doppler as needed of all accessible portions of each vessel. Bilateral testing is considered an integral part of a complete examination. Limited examinations for reoccurring indications may be performed as noted. The reflux portion of the exam is performed with the patient in reverse Trendelenburg.  +---------+---------------+---------+-----------+----------+--------------+ RIGHT    CompressibilityPhasicitySpontaneityPropertiesThrombus Aging +---------+---------------+---------+-----------+----------+--------------+ CFV      Full           Yes      Yes                                 +---------+---------------+---------+-----------+----------+--------------+ SFJ      Full                                                        +---------+---------------+---------+-----------+----------+--------------+ FV Prox  Full                                                        +---------+---------------+---------+-----------+----------+--------------+ FV Mid   Full                                                        +---------+---------------+---------+-----------+----------+--------------+ FV DistalFull                                                        +---------+---------------+---------+-----------+----------+--------------+ PFV      Full                                                        +---------+---------------+---------+-----------+----------+--------------+ POP      Full           Yes      Yes                                 +---------+---------------+---------+-----------+----------+--------------+ PTV      Full                                                        +---------+---------------+---------+-----------+----------+--------------+  PERO     Full                                                         +---------+---------------+---------+-----------+----------+--------------+ 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  +---------+---------------+---------+-----------+----------+--------------+ LEFT     CompressibilityPhasicitySpontaneityPropertiesThrombus Aging +---------+---------------+---------+-----------+----------+--------------+ CFV      Full           Yes      Yes                                 +---------+---------------+---------+-----------+----------+--------------+ SFJ      Full                                                        +---------+---------------+---------+-----------+----------+--------------+ FV Prox  Full                                                        +---------+---------------+---------+-----------+----------+--------------+ FV Mid   Full                                                        +---------+---------------+---------+-----------+----------+--------------+ FV DistalFull                                                        +---------+---------------+---------+-----------+----------+--------------+ PFV      Full                                                        +---------+---------------+---------+-----------+----------+--------------+ POP      Full           Yes      Yes                                 +---------+---------------+---------+-----------+----------+--------------+ PTV      Full                                                        +---------+---------------+---------+-----------+----------+--------------+ PERO     Full                                                        +---------+---------------+---------+-----------+----------+--------------+  A large complex, hypoechoic non vascularized structure located in the left popliteal fossa measuring 8.2 x 6.0 x 4.8 cm. etiology is unknown.    Summary: BILATERAL: - No evidence of deep vein thrombosis  seen in the lower extremities, bilaterally. -No evidence of popliteal cyst, bilaterally.   *See table(s) above for measurements and observations. Electronically signed by Jimmye Moulds MD on 02/20/2024 at 2:16:11 PM.    Final    IR THORACENTESIS ASP PLEURAL SPACE W/IMG GUIDE Result Date: 02/20/2024 INDICATION: 83 year old female with bilateral pleural effusions with left being greater than right for left diagnostic thoracentesis. EXAM: ULTRASOUND GUIDED LEFT THORACENTESIS MEDICATIONS: 10 mL 1% lidocaine COMPLICATIONS: None immediate. PROCEDURE: An ultrasound guided thoracentesis was thoroughly discussed with the patient and questions answered. The benefits, risks, alternatives and complications were also discussed. The patient understands and wishes to proceed with the procedure. Written consent was obtained. Ultrasound was performed to localize and mark an adequate pocket of fluid in the left chest. The area was then prepped and draped in the normal sterile fashion. 1% Lidocaine was used for local anesthesia. Under ultrasound guidance a 6 Fr Safe-T-Centesis catheter was introduced. Thoracentesis was performed. The catheter was removed and a dressing applied. FINDINGS: A total of approximately 0 mL of clear, red fluid was removed. Samples were sent to the laboratory as requested by the clinical team. IMPRESSION: Successful ultrasound guided left thoracentesis yielding 0 mL of pleural fluid. Performed By Lorinda Root, PA-C Electronically Signed   By: Myrlene Asper D.O.   On: 02/20/2024 13:00   US  RENAL Result Date: 02/20/2024 CLINICAL DATA:  Acute kidney insufficiency EXAM: RENAL / URINARY TRACT ULTRASOUND COMPLETE COMPARISON:  Renal ultrasound 02/07/2017 FINDINGS: Right Kidney: Renal measurements: 11.8 x 4.9 x 6.3 cm = volume: 192.0 mL. No collecting system dilatation. Echogenic shadowing foci in the right kidney measuring 14 mm consistent with stones. Left Kidney: Renal measurements: 9.8 x 4.7 x 5.1 cm  = volume: 122.6 mL. No collecting system dilatation. Echogenic shadowing areas in left kidney as well consistent with stones measuring up to 15 mm. Bladder: Appears normal for degree of bladder distention. Other: Bilateral small pleural effusions. IMPRESSION: Bilateral nonobstructing renal stones. No collecting system dilatation. Electronically Signed   By: Adrianna Horde M.D.   On: 02/20/2024 11:08   US  Abdomen Limited RUQ (LIVER/GB) Result Date: 02/20/2024 CLINICAL DATA:  Transaminitis EXAM: ULTRASOUND ABDOMEN LIMITED RIGHT UPPER QUADRANT COMPARISON:  Noncontrast CT 02/12/2017 FINDINGS: Gallbladder: Gallbladder is distended. No wall thickening, adjacent fluid. No reported Murphy's sign. Some echogenic lobular nonshadowing foci identified dependently, possible tumefactive sludge. Common bile duct: Diameter: 2 mm Liver: No focal lesion identified. Within normal limits in parenchymal echogenicity. Portal vein is patent on color Doppler imaging with normal direction of blood flow towards the liver. Other: Small right-sided pleural effusion seen. IMPRESSION: Tumefactive sludge. No shadowing stones, wall thickening or ductal dilatation. Small right pleural effusion. Electronically Signed   By: Adrianna Horde M.D.   On: 02/20/2024 11:06   DG Chest 1 View Result Date: 02/20/2024 CLINICAL DATA:  Attempted left thoracentesis. EXAM: CHEST  1 VIEW COMPARISON:  Feb 19, 2023. FINDINGS: No definite pneumothorax is noted. Bilateral pleural effusions are again noted, left greater than right. IMPRESSION: Bilateral pleural effusions as noted above. Electronically Signed   By: Rosalene Colon M.D.   On: 02/20/2024 09:13   CT Angio Chest PE W and/or Wo Contrast Result Date: 02/19/2024 CLINICAL DATA:  Dyspnea. EXAM: CT ANGIOGRAPHY CHEST WITH CONTRAST TECHNIQUE:  Multidetector CT imaging of the chest was performed using the standard protocol during bolus administration of intravenous contrast. Multiplanar CT image reconstructions  and MIPs were obtained to evaluate the vascular anatomy. RADIATION DOSE REDUCTION: This exam was performed according to the departmental dose-optimization program which includes automated exposure control, adjustment of the mA and/or kV according to patient size and/or use of iterative reconstruction technique. CONTRAST:  50mL OMNIPAQUE IOHEXOL 350 MG/ML SOLN COMPARISON:  January 26, 2017. FINDINGS: Cardiovascular: Moderate cardiomegaly is noted. No pericardial effusion. Coronary artery calcifications are noted. Atherosclerosis of thoracic aorta is noted. 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 There is no evidence of large central pulmonary embolus seen in main pulmonary artery or proximal portions of left and right pulmonary arteries. There is limited opacification of the lower lobe branches bilaterally potentially due to timing of contrast bolus as well as surrounding atelectasis. Smaller and more peripheral pulmonary emboli these areas cannot be excluded. Mediastinum/Nodes: No enlarged mediastinal, hilar, or axillary lymph nodes. Thyroid  gland, trachea, and esophagus demonstrate no significant findings. Lungs/Pleura: No pneumothorax is noted. Moderate bilateral pleural effusions are noted with associated atelectasis of both lower lobes. Upper Abdomen: No acute abnormality. Musculoskeletal: No chest wall abnormality. No acute or significant osseous findings. Review of the MIP images confirms the above findings. IMPRESSION: No definite evidence of large central pulmonary embolus seen in main pulmonary artery or proximal portions of left and right pulmonary arteries. However, there is very limited opacification of the lower lobe branches of both pulmonary arteries potentially due to timing of contrast bolus as well as surrounding atelectasis, and therefore smaller and more peripheral pulmonary emboli cannot be excluded on the basis of this exam. 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 Recommend annual  imaging followup by CTA or MRA. This recommendation follows 2010 ACCF/AHA/AATS/ACR/ASA/SCA/SCAI/SIR/STS/SVM Guidelines for the Diagnosis and Management of Patients with Thoracic Aortic Disease. Circulation. 2010; 121: Z610-R604. Aortic aneurysm NOS (ICD10-I71.9) Moderate bilateral pleural effusions are noted with adjacent atelectasis of both lower lobes. Enlarged main pulmonary artery is noted suggesting pulmonary artery hypertension. Moderate cardiomegaly. Coronary artery calcifications are noted suggesting coronary artery disease. Aortic Atherosclerosis (ICD10-I70.0). Electronically Signed   By: Rosalene Colon M.D.   On: 02/19/2024 14:05   DG Chest Port 1 View Result Date: 02/19/2024 CLINICAL DATA:  [id number] SOB (shortness of breath) [id number] EXAM: PORTABLE CHEST - 1 VIEW COMPARISON:  None available. FINDINGS: Retrocardiac airspace consolidation with blunting of the costophrenic sulcus. Blunting of the right costophrenic angle with patchy right basilar airspace opacities, also present. No pneumothorax. Skin fold artifact along the right lung. Mild cardiomegaly. Tortuous aorta with aortic atherosclerosis. No acute fracture or destructive lesions. Multilevel thoracic osteophytosis. Osteopenia. IMPRESSION: Small bilateral pleural effusions, larger on the left than the right, with bibasilar airspace opacities, likely atelectasis. Electronically Signed   By: Rance Burrows M.D.   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  proBNP elevated to >35,000 - CTA chest 5/13 showed no definite evidence of central PE, moderate bilateral pleural effusions, enlarged main pulmonary artery suggesting pulmonary artery hypertension - Echocardiogram this admission showed EF 20-25%, moderate LVH, grade III DD, moderately reduced RV systolic function, mild-moderate MR -  Patient has been on IV Lasix 40 mg twice daily.  Attempted thoracentesis yesterday but no fluid was drained  - Patient reports urine  output on Lasix.  I's/O's not fully recorded.  She continues to have diminished breath sounds in bilateral lung bases.  Continue IV Lasix 40 mg twice daily - Strict I's/O's, daily weights.  BMP in a.m. to follow kidney function - Patient has coronary artery calcifications on CTA chest.  Would likely benefit from ischemic evaluation.  Her creatinine remains elevated to 1.73 so is not a candidate for LHC at this time  - With grade 3 diastolic dysfunction and frequent PVCs, patient would likely also benefit from cardiac MRI.  May be a good candidate for stress MRI. Will discuss further with MD  - No BB with low HR  - No spiro, ARNI at this time with AKI  - Start hydralazine 25 mg TID for elevated BP. Titrated as needed for hypertension  - Start farxiga 10 mg daily   PVCs - EKG on arrival to the ED showed ventricular trigeminy.  Telemetry continues to show frequent PVCs - K 3.6, mag 1.9. Ordered K supplement to maintain K>4  - Unable to start BB with HR intermittently in the 50s   Elevated troponin  - hsTn 288>286 - No chest pain. Not consistent with ACS  - As above, will need ischemic evaluation in the near future    Coronary Artery Calcifications  - CTA chest on 5/13 noted coronary artery calcifications - No statin at this time with transaminitis  - No chest pain   AKI vs CKD  - Creatinine elevated to 1.68 on arrival - No labs are available for review in system since 2018.  Baseline kidney function unknown.  She admits that she has not seen a primary care provider in well over a year and has not had labs drawn in that time - Continue to monitor renal function with diuresis.  Possible this is cardiorenal, but despite good urine output and improvement in volume status her kidney function has not improved  Ascending aortic aneurysm  - CTA chest this admission  incidentally noted 4.2 cm ascending aortic aneurysm - Radiology recommended annual follow-up by CTA or MRA  Otherwise per primary  - Transaminits- thought to be secondary to hepatic congestion with CHF. RUQ was without any significant abnormalities  - History of unprovoked PE- no central PE on CTA this admission. B/L lower extremity dopplers negative for DVT.  - UTI- on rocephin    Risk Assessment/Risk Scores:    [place]  Heart Association (NYHA) Functional Class NYHA Class III     For questions or updates, please contact [facility] Please consult [email or web address] for contact info under    Signed, Debria Fang, PA-C  02/21/2024 10:42 AM

## 2024-02-21 NOTE — Progress Notes (Signed)
 Oxygen saturations ~94% on 2LNC. Attempted to wean at bedtime. Pt O2 dropped to mid 80's. Diminished breath sounds. Pt denies SOB or CP. Pt placed back on 2LNC overnight with O2 sats in low to mid 90's. Pt has frequent apneic episodes while sleeping.

## 2024-02-21 NOTE — Progress Notes (Signed)
 Heart Failure Nurse Navigator Progress Note  PCP: Ozell Blunt, MD PCP-Cardiologist: None Admission Diagnosis: Acute respiratory failure with hypoxia, Pleural effusion, elevated troponin Admitted from: Home  Presentation:   Regina Bell presented with increased shortness of breath over the last week. BP 162/97, HR 87, Pro BNP >35,000, D-Dimer 8.48, CXR with small bilateral pleural effusions. CTA negative for large PE but limited due to contrast timing and surrounding atelectasis. Attempted thoracentesis on 5/14 - no fluid collected.   Patient and her son were educated on the sign and symptoms of heart failure, daily weights, when to call her doctor or go to the ED, Diet/ fluid restrictions, taking all her medications as prescribed and attending all medical appointments. Both patient and her son verbalized their understanding of the education. A HF TOC appointment was scheduled for 02/29/2024 @ 11 am.   ECHO/ LVEF: 20-25%  Clinical Course:  History reviewed. No pertinent past medical history.   Social History   Socioeconomic History   Marital status: Married    Spouse name: Not on file   Number of children: Not on file   Years of education: Not on file   Highest education level: Not on file  Occupational History   Not on file  Tobacco Use   Smoking status: Former   Smokeless tobacco: Never  Substance and Sexual Activity   Alcohol use: No   Drug use: No   Sexual activity: Not on file  Other Topics Concern   Not on file  Social History Narrative   ** Merged History Encounter **       Social Drivers of Health   Financial Resource Strain: Not on file  Food Insecurity: No Food Insecurity (02/20/2024)   Hunger Vital Sign    Worried About Running Out of Food in the Last Year: Never true    Ran Out of Food in the Last Year: Never true  Transportation Needs: No Transportation Needs (02/20/2024)   PRAPARE - Administrator, Civil Service (Medical): No     Lack of Transportation (Non-Medical): No  Physical Activity: Not on file  Stress: Not on file  Social Connections: Socially Integrated (02/21/2024)   Social Connection and Isolation Panel [NHANES]    Frequency of Communication with Friends and Family: Three times a week    Frequency of Social Gatherings with Friends and Family: Three times a week    Attends Religious Services: More than 4 times per year    Active Member of Clubs or Organizations: Yes    Attends Engineer, structural: More than 4 times per year    Marital Status: Married   Water engineer and Provision:  Detailed education and instructions provided on heart failure disease management including the following:  Signs and symptoms of Heart Failure When to call the physician Importance of daily weights Low sodium diet Fluid restriction Medication management Anticipated future follow-up appointments  Patient education given on each of the above topics.  Patient acknowledges understanding via teach back method and acceptance of all instructions.  Education Materials:  "Living Better With Heart Failure" Booklet, HF zone tool, & Daily Weight Tracker Tool.  Patient has scale at home: yes Patient has pill box at home: yes    High Risk Criteria for Readmission and/or Poor Patient Outcomes: Heart failure hospital admissions (last 6 months): 1  No Show rate: 0 Difficult social situation: No Demonstrates medication adherence: Yes Primary Language: English Literacy level: Reading, writing, and comprehension  Barriers of  Care:   Diet/ fluid restrictions/ daily weights  Considerations/Referrals:   Referral made to Heart Failure Pharmacist Stewardship: Yes Referral made to Heart Failure CSW/NCM TOC: No Referral made to Heart & Vascular TOC clinic: Yes, 02/29/2024 @ 11 am.   Items for Follow-up on DC/TOC: Continued HF education Diet/ fluid restrictions/ daily weights   Randie Bustle, BSN, RN Heart Failure  Teacher, adult education Only

## 2024-02-21 NOTE — Progress Notes (Signed)
   Heart Failure Stewardship Pharmacist Progress Note   PCP: Ozell Blunt, MD PCP-Cardiologist: None    HPI:  83 yo F with PMH of PE.   Presented to the ED on 5/13 with shortness of breath x 1 week and fatigue. proBNP >35k. Trop 540>981. D-dimer elevated 8.48. CXR with small bilateral pleural effusions. CTA negative for large PE but limited due to contrast timing and surrounding atelectasis. ECHO on 5/14 with LVEF reduced to 20-25%, global hypokinesis, moderate concentric LVH, G3DD, RV moderately reduced, mild to moderate MR. Attempted thoracentesis on 5/14 - no fluid collected. May undergo stress test prior to discharge.   Met with patient and her son at bedside. Her daughter was called during the visit today so she could participate and ask questions. Denies shortness of breath, remains on 2L O2. No LE edema. Denies any symptoms of UTI now or prior to admission. Completed both disease state education and medication education. All questions and concerns answered and addressed.   Current HF Medications: Diuretic: furosemide 40 mg IV BID Other: hydralazine 25 mg TID  Prior to admission HF Medications: None  Pertinent Lab Values: Serum creatinine 1.68>1.73, BUN 30, Potassium 3.6, Sodium 141, proBNP >35000, Magnesium 1.9  Vital Signs: Weight: 180 lbs (admission weight: 188 lbs) - bed weight Blood pressure: 150-170/70s  Heart rate: 50-70s  I/O: incomplete  Medication Assistance / Insurance Benefits Check: Does the patient have prescription insurance?  Yes Type of insurance plan: [facility] Medicare  Outpatient Pharmacy:  Prior to admission outpatient pharmacy: [facility] Is the patient willing to use [facility] TOC pharmacy at discharge? Yes Is the patient willing to transition their outpatient pharmacy to utilize a [facility] outpatient pharmacy?   Pending    Assessment: 1. Acute systolic CHF (LVEF 20-25%), due to unknown etiology, not planning cath per cardiology but may get stress test  before discharge. NYHA class III symptoms. - Continue furosemide 40 mg IV BID given oxygen requirement. May be close to transitioning to PO. Strict I/Os and daily weights. Keep K>4 and Mg>2. - Consider starting carvedilol 3.125 mg BID once euvolemic pending HR. Pulse has been ranging 50-70s.  - Consider starting Entresto 24/26 mg BID and spironolactone 25 mg daily pending improvement in renal function. Unclear baseline. - Consider adding Farxiga 10 mg daily. Noted on admission to have nitrates, leukocytes, and bacteria in urine. Was started on ceftriaxone for presumed UTI. Cultures pending. Denies any symptoms of this.    Plan: 1) Medication changes recommended at this time: - Add Farxiga 10 mg daily  2) Patient assistance: - $255 deductible remaining on insurance plan - Entresto copay once deductible is met $47 - Farxiga copay once deductible is met $47  3)  Education  - Initial education completed - To be completed prior to discharge  Jerilyn Monte, PharmD, BCPS Heart Failure Engineer, building services Phone [phone]

## 2024-02-21 NOTE — Progress Notes (Signed)
   02/21/24 0943  TOC Brief Assessment  Insurance and Status Reviewed [facility] Medicare)  Patient has primary care physician Yes Fredda Jacobus, Albin Altes, MD)  Home environment has been reviewed from home  Prior level of function: independent  Prior/Current Home Services No current home services  Social Drivers of Health Review SDOH reviewed no interventions necessary  Readmission risk has been reviewed Yes (10%)  Transition of care needs no transition of care needs at this time   No TOC needs at this time. Please place a TOC consult should a need arise.

## 2024-02-21 NOTE — Telephone Encounter (Signed)
 Patient Advocate Encounter  Test billing under this patients current coverage for Entresto, Farxiga shows that there is a $255 deductible remaining for 2025. Once deductible is met, both medications should have copays of $47 for 30 days, $141 for 90 days.  This indicates that the first medication to be filled should have a price of $302, and the other medication should fill for $47 copay.  Kennis Peacock, CPhT Rx Patient Advocate Phone: [phone]

## 2024-02-21 NOTE — Significant Event (Signed)
 Rapid Response Event Note   Reason for Call :  Called originally at 1859 for tachycardia-160s. At that time, RN was getting EKG and awaiting callback from MD. Pt was asymptomatic and other VSS.  RRT called back at 1915 d/t order for adenosine.  Initial Focused Assessment:  Pt lying in bed with eyes open. She is in no visible distress. She denies CP/SOB/dizziness/palpitations. Lungs clear t/o. Skin warm and dry.  T-97.9, -HR-160S, BP-107/78, RR-18, SpO2-94% on [place] 2L  Pt placed on Zoll monitor PTA RRT. Pt given 6mg  Adenosive at 1920 with no change in HR/RHYTHM. Pt given 12mg  Adenosine at 1927 with HR decreasing to 90s SR with freq PVCS.   Interventions:  EKG-SVT Zoll pads on pt 6mg  Adenosine IV 12mg  Adenosine IV Toprol XL 12.5 mg PO daily  Plan of Care:  Pt HR/rhythm better after interventions. Continue to monitor pt. Please call RRT if further assistance needed.  Event Summary:   MD Notified: Dr. Alvis Ba and Heidi Llamas, Cards NP at bedside on RRT arrival Call Time:1859 Arrival Time:1917 End Time:1935  Dorrine Gaudy, RN

## 2024-02-21 NOTE — Progress Notes (Addendum)
    Called by RN with patient developing narrow complex tachycardia with rates in the 160s. EKG shows SVT 168bpm. At the time of interview she is hemodynamically stable, BP in the 100s systolic. Really no complaints, not really aware of rhythm. Discussed with patient the need for adenosine. Dr. Alvis Ba at the bedside along with RR RN. Patient on Zoll. Given 6mg  adenosine with no change in rhythm. Repeat with 12mg  adenosine with conversion to sinus bradycardia, bigeminy initially. Then rates up to the 80s with continued PVCs. Initially planned to avoid BB therapy with low LVEF, but with episode of SVT and freq PVCs will add low dose Toprol XL 12.5mg  daily this evening in attempts to suppress rhythm.   Signed, Johnie Nailer, NP-C 02/21/2024, 7:42 PM Pager: [phone]  ECG during tachycardia shows regular rhythm, QRS complex mildly prolonged but identical with a QRS during sinus rhythm, pseudo r' in lead V1, strongly consistent with AV node reentry.  Did not respond to carotid sinus compression, resolved after the 12 mg dose of IV adenosine.  Return to normal sinus rhythm with very frequent monomorphic PVCs, which may be the trigger for the AV node reentry.  Will start a very low-dose of metoprolol (note recent decompensated heart failure, but symptomatically greatly improved following diuresis).  Luana Rumple, MD, [facility] [phone] office [phone] pager

## 2024-02-21 NOTE — Progress Notes (Addendum)
 PROGRESS NOTE        PATIENT DETAILS Name: Regina Bell Age: 83 y.o. Sex: female Date of Birth: 1941/07/24 Admit Date: 02/19/2024 Admitting Physician Regina Galla Regina Gallery, MD ZOX:[id number], Regina Altes, MD  Brief Summary: Patient is a 83 y.o.  female with history of pulmonary embolism-presented with shortness of breath-upon further evaluation-she was found to have new onset HFrEF.  Significant events: 5/13>> admit to [facility]  Significant studies: 5/13>> CTA chest: No large PE-Limited study of lower lobe branches, 4.2 cm ascending aortic aneurysm.  Moderate B/L pleural effusions, enlarged main pulmonary artery.  Moderate cardiomegaly.  Coronary artery calcifications. 5/14>> renal ultrasound: No hydronephrosis 5/14>> RUQ ultrasound: No cholecystitis-no shadowing stones-+ sludge. 5/14>> B/L lower extremity Doppler: No DVT 5/14>> echo: EF 20-25%, RV systolic function moderately reduced.  Grade 3 diastolic dysfunction.  Significant microbiology data: 5/13>> COVID/influenza/RSV PCR: Negative  Procedures: 5/14>> attempted thoracocentesis-dry tap.  Consults: Cardiology  Subjective: Overall better but still some SOB when ambulating.  Objective: Vitals: Blood pressure (!) 178/75, pulse 71, temperature (!) 97.4 F (36.3 C), temperature source Oral, resp. rate 19, height 5\' 5"  (1.651 m), weight 81.7 kg, SpO2 93%.   Exam: Gen Exam:Alert awake-not in any distress HEENT:atraumatic, normocephalic+ JVD Chest: B/L clear to auscultation anteriorly CVS:S1S2 regular Abdomen:soft non tender, non distended Extremities:no edema Neurology: Non focal Skin: no rash  Pertinent Labs/Radiology:    Latest Ref Rng & Units 02/21/2024    4:35 AM 02/20/2024    4:28 AM 02/19/2024    9:33 AM  CBC  WBC 4.0 - 10.5 K/uL 8.7  8.5  9.2   Hemoglobin 12.0 - 15.0 g/dL 19.1  47.8  29.5   Hematocrit 36.0 - 46.0 % 40.5  39.3  41.2   Platelets 150 - 400 K/uL 202  201  209     Lab  Results  Component Value Date   NA 141 02/21/2024   K 3.6 02/21/2024   CL 99 02/21/2024   CO2 31 02/21/2024      Assessment/Plan: Acute hypoxic respiratory failure Secondary to new onset HFrEF Appears comfortable-either on room air or on just 2 L of oxygen Continue diuretics and wean oxygen as able.  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  AKI Likely hemodynamically mediated-potentially cardiorenal syndrome UA negative for proteinuria Renal ultrasound without hydronephrosis Supportive care-avoid nephrotoxic agents Volume status reasonable Continue to trend electrolytes  Transaminitis Suspect secondary to hepatic congestion from CHF Acute hepatitis serology-negative RUQ ultrasound-without any significant abnormalities  B/L pleural effusion Dry tap on attempted thoracocentesis on 5/14 Etiology likely due to CHF-transudative physiology.  History of unprovoked PE 2018 No central PE seen on CTA B/L lower extremity Dopplers negative for DVT Per my discussion with patient on 5/14-looks like she completed 6 months of Xarelto  and then was taken off anticoagulation.  UTI Rocephin x 3 days Follow cultures.  4.2 cm ascending aortic aneurysm Incidental finding on CTA chest Radiology recommending annual follow-up by CTA or MRA.  Class I obesity: Estimated body mass index is 29.97 kg/m as calculated from the following:   Height as of this encounter: 5\' 5"  (1.651 m).   Weight as of this encounter: 81.7 kg.   Code  status:   Code Status: Full Code   DVT Prophylaxis: heparin  injection 5,000 Units Start: 02/19/24 2200   Family Communication: Son [phone] left VM 5/15   Disposition  Plan: Status is: Observation The patient will require care spanning > 2 midnights and should be moved to inpatient because: Severity of illness   Planned Discharge Destination:Home   Diet: Diet Order             Diet Heart Room service appropriate? Yes; Fluid consistency: Thin; Fluid restriction: 1800 mL Fluid  Diet effective now                     Antimicrobial agents: Anti-infectives (From admission, onward)    Start     Dose/Rate Route Frequency Ordered Stop   02/20/24 1000  cefTRIAXone (ROCEPHIN) 1 g in sodium chloride  0.9 % 100 mL IVPB        1 g 200 mL/hr over 30 Minutes Intravenous Every 24 hours 02/19/24 1726     02/19/24 1100  cefTRIAXone (ROCEPHIN) 1 g in sodium chloride  0.9 % 100 mL IVPB        1 g 200 mL/hr over 30 Minutes Intravenous  Once 02/19/24 1045 02/19/24 1200        MEDICATIONS: Scheduled Meds:  furosemide  40 mg Intravenous BID   heparin   5,000 Units Subcutaneous Q8H   sodium chloride  flush  3 mL Intravenous Q12H   Continuous Infusions:  cefTRIAXone (ROCEPHIN)  IV 1 g (02/21/24 0825)   PRN Meds:.acetaminophen  **OR** acetaminophen , polyethylene glycol   I have personally reviewed following labs and imaging studies  LABORATORY DATA: CBC: Recent Labs  Lab 02/19/24 0933 02/20/24 0428 02/21/24 0435  WBC 9.2 8.5 8.7  NEUTROABS 7.3  --   --   HGB 13.4 12.7 13.3  HCT 41.2 39.3 40.5  MCV 92.8 93.1 92.5  PLT 209 201 202    Basic Metabolic Panel: Recent Labs  Lab 02/19/24 0933 02/20/24 0428 02/21/24 0435  NA 141 140 141  K 4.2 3.5 3.6  CL 104 105 99  CO2 21* 26 31  GLUCOSE 126* 106* 90  BUN 39* 36* 30*  CREATININE 1.68* 1.68* 1.73*  CALCIUM 9.7 8.7* 9.0  MG  --  1.9  --     GFR: Estimated Creatinine Clearance: 26.5 mL/min (A) (by C-G formula based on SCr of 1.73 mg/dL (H)).  Liver Function Tests: Recent Labs  Lab 02/19/24 0933 02/20/24 0428  AST 218* 150*  ALT 493* 377*  ALKPHOS 43 25*  BILITOT 1.1 0.9  PROT  6.3* 5.8*  ALBUMIN 3.6 2.9*   No results for input(s): "LIPASE", "AMYLASE" in the last 168 hours. No results for input(s): "AMMONIA" in the last 168 hours.  Coagulation Profile: No results for input(s): "INR", "PROTIME" in the last 168 hours.  Cardiac Enzymes: No results for input(s): "CKTOTAL", "CKMB", "CKMBINDEX", "TROPONINI" in the last 168 hours.  BNP (last 3 results) Recent Labs    02/19/24 0933  PROBNP >35,000.0*    Lipid Profile: No results for input(s): "CHOL", "HDL", "LDLCALC", "TRIG", "CHOLHDL", "LDLDIRECT" in the last 72 hours.  Thyroid  Function Tests: No results for input(s): "TSH", "T4TOTAL", "FREET4", "T3FREE", "THYROIDAB" in the last 72 hours.  Anemia Panel: No results for input(s): "VITAMINB12", "FOLATE", "FERRITIN", "TIBC", "IRON", "RETICCTPCT" in the last 72 hours.  Urine analysis:    Component Value Date/Time   COLORURINE YELLOW 02/19/2024 0933   APPEARANCEUR HAZY (A) 02/19/2024 0933   LABSPEC 1.008 02/19/2024 9147  PHURINE 7.5 02/19/2024 0933   GLUCOSEU NEGATIVE 02/19/2024 0933   HGBUR NEGATIVE 02/19/2024 0933   BILIRUBINUR NEGATIVE 02/19/2024 0933   KETONESUR NEGATIVE 02/19/2024 0933   PROTEINUR NEGATIVE 02/19/2024 0933   NITRITE POSITIVE (A) 02/19/2024 0933   LEUKOCYTESUR LARGE (A) 02/19/2024 0933    Sepsis Labs: Lactic Acid, Venous No results found for: "LATICACIDVEN"  MICROBIOLOGY: Recent Results (from the past 240 hours)  Resp panel by RT-PCR (RSV, Flu A&B, Covid) Anterior Nasal Swab     Status: None   Collection Time: 02/19/24  9:33 AM   Specimen: Anterior Nasal Swab  Result Value Ref Range Status   SARS Coronavirus 2 by RT PCR NEGATIVE NEGATIVE Final    Comment: (NOTE) SARS-CoV-2 target nucleic acids are NOT DETECTED.  The SARS-CoV-2 RNA is generally detectable in upper respiratory specimens during the acute phase of infection. The lowest concentration of SARS-CoV-2 viral copies this assay can detect is 138 copies/mL. A negative  result does not preclude SARS-Cov-2 infection and should not be used as the sole basis for treatment or other patient management decisions. A negative result may occur with  improper specimen collection/handling, submission of specimen other than nasopharyngeal swab, presence of viral mutation(s) within the areas targeted by this assay, and inadequate number of viral copies(<138 copies/mL). A negative result must be combined with clinical observations, patient history, and epidemiological information. The expected result is Negative.  Fact Sheet for Patients:  BloggerCourse.com  Fact Sheet for Healthcare Providers:  SeriousBroker.it  This test is no t yet approved or cleared by the [place]  FDA and  has been authorized for detection and/or diagnosis of SARS-CoV-2 by FDA under an Emergency Use Authorization (EUA). This EUA will remain  in effect (meaning this test can be used) for the duration of the COVID-19 declaration under Section 564(b)(1) of the Act, 21 U.S.C.section 360bbb-3(b)(1), unless the authorization is terminated  or revoked sooner.       Influenza A by PCR NEGATIVE NEGATIVE Final   Influenza B by PCR NEGATIVE NEGATIVE Final    Comment: (NOTE) The Xpert Xpress SARS-CoV-2/FLU/RSV plus assay is intended as an aid in the diagnosis of influenza from Nasopharyngeal swab specimens and should not be used as a sole basis for treatment. Nasal washings and aspirates are unacceptable for Xpert Xpress SARS-CoV-2/FLU/RSV testing.  Fact Sheet for Patients: BloggerCourse.com  Fact Sheet for Healthcare Providers: SeriousBroker.it  This test is not yet approved or cleared by the [place]  FDA and has been authorized for detection and/or diagnosis of SARS-CoV-2 by FDA under an Emergency Use Authorization (EUA). This EUA will remain in effect (meaning this test can be used)  for the duration of the COVID-19 declaration under Section 564(b)(1) of the Act, 21 U.S.C. section 360bbb-3(b)(1), unless the authorization is terminated or revoked.     Resp Syncytial Virus by PCR NEGATIVE NEGATIVE Final    Comment: (NOTE) Fact Sheet for Patients: BloggerCourse.com  Fact Sheet for Healthcare Providers: SeriousBroker.it  This test is not yet approved or cleared by the [place]  FDA and has been authorized for detection and/or diagnosis of SARS-CoV-2 by FDA under an Emergency Use Authorization (EUA). This EUA will remain in effect (meaning this test can be used) for the duration of the COVID-19 declaration under Section 564(b)(1) of the Act, 21 U.S.C. section 360bbb-3(b)(1), unless the authorization is terminated or revoked.  Performed at [facility], [address], [place], [place] [address]     RADIOLOGY STUDIES/RESULTS: ECHOCARDIOGRAM COMPLETE Result Date: 02/20/2024  ECHOCARDIOGRAM REPORT   Patient Name:   Regina Bell Date of Exam: 02/20/2024 Medical Rec #:  [id number]         Height:       65.0 in Accession #:    [id number]        Weight:       188.3 lb Date of Birth:  [birth year]          BSA:          1.928 m Patient Age:    83 years          BP:           185/92 mmHg Patient Gender: F                 HR:           80 bpm. Exam Location:  Inpatient Procedure: 2D Echo, Cardiac Doppler, Color Doppler and Intracardiac            Opacification Agent (Both Spectral and Color Flow Doppler were            utilized during procedure). Indications:    CHF  History:        Patient has no prior history of Echocardiogram examinations.                 CHF.  Sonographer:    Astrid Blamer Referring Phys: [id number] Gayl Katos MELVIN IMPRESSIONS  1. Left ventricular ejection fraction, by estimation, is 20 to 25%. The left ventricle has severely decreased function. The left ventricle demonstrates global  hypokinesis. There is moderate concentric left ventricular hypertrophy. Left ventricular diastolic parameters are consistent with Grade III diastolic dysfunction (restrictive).  2. Right ventricular systolic function is moderately reduced. The right ventricular size is normal. There is normal pulmonary artery systolic pressure. The estimated right ventricular systolic pressure is 33.3 mmHg.  3. Left atrial size was severely dilated.  4. Right atrial size was mildly dilated.  5. Moderate pleural effusion in the left lateral region.  6. The mitral valve is degenerative. Mild to moderate mitral valve regurgitation. Mild mitral stenosis.  7. The aortic valve is tricuspid. There is mild calcification of the aortic valve. Aortic valve regurgitation is mild. Aortic valve sclerosis/calcification is present, without any evidence of aortic stenosis.  8. The inferior vena cava is normal in size with greater than 50% respiratory variability, suggesting right atrial pressure of 3 mmHg. Conclusion(s)/Recommendation(s): Consider evalaution for infiltrative cardiomopathy, if clinically indicated. FINDINGS  Left Ventricle: Left ventricular ejection fraction, by estimation, is 20 to 25%. The left ventricle has severely decreased function. The left ventricle demonstrates global hypokinesis. Definity contrast agent was given IV to delineate the left ventricular endocardial borders. The left ventricular internal cavity size was normal in size. There is moderate concentric left ventricular hypertrophy. Left ventricular diastolic parameters are consistent with Grade III diastolic dysfunction (restrictive). Right Ventricle: The right ventricular size is normal. No increase in right ventricular wall thickness. Right ventricular systolic function is moderately reduced. There is normal pulmonary artery systolic pressure. The tricuspid regurgitant velocity is 2.66 m/s, and with an assumed right atrial pressure of 5 mmHg, the estimated right  ventricular systolic pressure is 33.3 mmHg. Left Atrium: Left atrial size was severely dilated. Right Atrium: Right atrial size was mildly dilated. Pericardium: There is no evidence of pericardial effusion. Mitral Valve: The mitral valve is degenerative in appearance. There is moderate calcification of the mitral valve leaflet(s). Mild to moderate mitral valve regurgitation.  Mild mitral valve stenosis. MV peak gradient, 6.7 mmHg. The mean mitral valve gradient is 3.0 mmHg. Tricuspid Valve: The tricuspid valve is normal in structure. Tricuspid valve regurgitation is mild . No evidence of tricuspid stenosis. Aortic Valve: The aortic valve is tricuspid. There is mild calcification of the aortic valve. Aortic valve regurgitation is mild. Aortic valve sclerosis/calcification is present, without any evidence of aortic stenosis. Aortic valve mean gradient measures 2.0 mmHg. Aortic valve peak gradient measures 4.2 mmHg. Aortic valve area, by VTI measures 2.51 cm. Pulmonic Valve: The pulmonic valve was normal in structure. Pulmonic valve regurgitation is mild. No evidence of pulmonic stenosis. Aorta: The aortic root is normal in size and structure. Venous: The inferior vena cava is normal in size with greater than 50% respiratory variability, suggesting right atrial pressure of 3 mmHg. IAS/Shunts: No atrial level shunt detected by color flow Doppler. Additional Comments: There is a moderate pleural effusion in the left lateral region.  LEFT VENTRICLE PLAX 2D LVIDd:         5.40 cm   Diastology LVIDs:         4.90 cm   LV e' medial:    2.28 cm/s LV PW:         1.20 cm   LV E/e' medial:  60.1 LV IVS:        1.00 cm   LV e' lateral:   4.90 cm/s LVOT diam:     1.90 cm   LV E/e' lateral: 28.0 LV SV:         38 LV SV Index:   20 LVOT Area:     2.84 cm  RIGHT VENTRICLE RV S prime:     9.79 cm/s TAPSE (M-mode): 1.3 cm LEFT ATRIUM              Index        RIGHT ATRIUM           Index LA Vol (A2C):   90.2 ml  46.78 ml/m  RA Area:      19.20 cm LA Vol (A4C):   129.0 ml 66.91 ml/m  RA Volume:   43.30 ml  22.46 ml/m LA Biplane Vol: 109.0 ml 56.54 ml/m  AORTIC VALVE AV Area (Vmax):    2.06 cm AV Area (Vmean):   2.24 cm AV Area (VTI):     2.51 cm AV Vmax:           102.00 cm/s AV Vmean:          64.000 cm/s AV VTI:            0.150 m AV Peak Grad:      4.2 mmHg AV Mean Grad:      2.0 mmHg LVOT Vmax:         74.10 cm/s LVOT Vmean:        50.600 cm/s LVOT VTI:          0.133 m LVOT/AV VTI ratio: 0.89  AORTA Ao Root diam: 3.40 cm MITRAL VALVE                TRICUSPID VALVE MV Area (PHT): 3.87 cm     TR Peak grad:   28.3 mmHg MV Area VTI:   1.30 cm     TR Vmax:        266.00 cm/s MV Peak grad:  6.7 mmHg MV Mean grad:  3.0 mmHg     SHUNTS MV Vmax:       1.29 m/s  Systemic VTI:  0.13 m MV Vmean:      75.8 cm/s    Systemic Diam: 1.90 cm MV Decel Time: 196 msec MV E velocity: 137.00 cm/s MV A velocity: 77.40 cm/s MV E/A ratio:  1.77 Jules Oar MD Electronically signed by Jules Oar MD Signature Date/Time: 02/20/2024/6:47:21 PM    Final    VAS US  LOWER EXTREMITY VENOUS (DVT) Result Date: 02/20/2024  Lower Venous DVT Study Patient Name:  Regina Bell  Date of Exam:   02/20/2024 Medical Rec #: [id number]          Accession #:    [id number] Date of Birth: 1940-12-14           Patient Gender: F Patient Age:   52 years Exam Location:  [facility] Procedure:      VAS US  LOWER EXTREMITY VENOUS (DVT) Referring Phys: Kimberly Penna --------------------------------------------------------------------------------  Indications: Pulmonary embolism, and SOB.  Risk Factors: Confirmed PE. Comparison Study: No priors. Performing Technologist: Franky Ivanoff Sturdivant-Jones RDMS, RVT  Examination Guidelines: A complete evaluation includes B-mode imaging, spectral Doppler, color Doppler, and power Doppler as needed of all accessible portions of each vessel. Bilateral testing is considered an integral part of a complete examination. Limited  examinations for reoccurring indications may be performed as noted. The reflux portion of the exam is performed with the patient in reverse Trendelenburg.  +---------+---------------+---------+-----------+----------+--------------+ RIGHT    CompressibilityPhasicitySpontaneityPropertiesThrombus Aging +---------+---------------+---------+-----------+----------+--------------+ CFV      Full           Yes      Yes                                 +---------+---------------+---------+-----------+----------+--------------+ SFJ      Full                                                        +---------+---------------+---------+-----------+----------+--------------+ FV Prox  Full                                                        +---------+---------------+---------+-----------+----------+--------------+ FV Mid   Full                                                        +---------+---------------+---------+-----------+----------+--------------+ FV DistalFull                                                        +---------+---------------+---------+-----------+----------+--------------+ PFV      Full                                                        +---------+---------------+---------+-----------+----------+--------------+  POP      Full           Yes      Yes                                 +---------+---------------+---------+-----------+----------+--------------+ PTV      Full                                                        +---------+---------------+---------+-----------+----------+--------------+ PERO     Full                                                        +---------+---------------+---------+-----------+----------+--------------+ 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  +---------+---------------+---------+-----------+----------+--------------+ LEFT      CompressibilityPhasicitySpontaneityPropertiesThrombus Aging +---------+---------------+---------+-----------+----------+--------------+ CFV      Full           Yes      Yes                                 +---------+---------------+---------+-----------+----------+--------------+ SFJ      Full                                                        +---------+---------------+---------+-----------+----------+--------------+ FV Prox  Full                                                        +---------+---------------+---------+-----------+----------+--------------+ FV Mid   Full                                                        +---------+---------------+---------+-----------+----------+--------------+ FV DistalFull                                                        +---------+---------------+---------+-----------+----------+--------------+ PFV      Full                                                        +---------+---------------+---------+-----------+----------+--------------+ POP      Full           Yes      Yes                                 +---------+---------------+---------+-----------+----------+--------------+  PTV      Full                                                        +---------+---------------+---------+-----------+----------+--------------+ PERO     Full                                                        +---------+---------------+---------+-----------+----------+--------------+ A large complex, hypoechoic non vascularized structure located in the left popliteal fossa measuring 8.2 x 6.0 x 4.8 cm. etiology is unknown.    Summary: BILATERAL: - No evidence of deep vein thrombosis seen in the lower extremities, bilaterally. -No evidence of popliteal cyst, bilaterally.   *See table(s) above for measurements and observations. Electronically signed by Jimmye Moulds MD on 02/20/2024 at 2:16:11 PM.    Final    IR  THORACENTESIS ASP PLEURAL SPACE W/IMG GUIDE Result Date: 02/20/2024 INDICATION: 83 year old female with bilateral pleural effusions with left being greater than right for left diagnostic thoracentesis. EXAM: ULTRASOUND GUIDED LEFT THORACENTESIS MEDICATIONS: 10 mL 1% lidocaine COMPLICATIONS: None immediate. PROCEDURE: An ultrasound guided thoracentesis was thoroughly discussed with the patient and questions answered. The benefits, risks, alternatives and complications were also discussed. The patient understands and wishes to proceed with the procedure. Written consent was obtained. Ultrasound was performed to localize and mark an adequate pocket of fluid in the left chest. The area was then prepped and draped in the normal sterile fashion. 1% Lidocaine was used for local anesthesia. Under ultrasound guidance a 6 Fr Safe-T-Centesis catheter was introduced. Thoracentesis was performed. The catheter was removed and a dressing applied. FINDINGS: A total of approximately 0 mL of clear, red fluid was removed. Samples were sent to the laboratory as requested by the clinical team. IMPRESSION: Successful ultrasound guided left thoracentesis yielding 0 mL of pleural fluid. Performed By Lorinda Root, PA-C Electronically Signed   By: Myrlene Asper D.O.   On: 02/20/2024 13:00   US  RENAL Result Date: 02/20/2024 CLINICAL DATA:  Acute kidney insufficiency EXAM: RENAL / URINARY TRACT ULTRASOUND COMPLETE COMPARISON:  Renal ultrasound 02/07/2017 FINDINGS: Right Kidney: Renal measurements: 11.8 x 4.9 x 6.3 cm = volume: 192.0 mL. No collecting system dilatation. Echogenic shadowing foci in the right kidney measuring 14 mm consistent with stones. Left Kidney: Renal measurements: 9.8 x 4.7 x 5.1 cm = volume: 122.6 mL. No collecting system dilatation. Echogenic shadowing areas in left kidney as well consistent with stones measuring up to 15 mm. Bladder: Appears normal for degree of bladder distention. Other: Bilateral small pleural  effusions. IMPRESSION: Bilateral nonobstructing renal stones. No collecting system dilatation. Electronically Signed   By: Adrianna Horde M.D.   On: 02/20/2024 11:08   US  Abdomen Limited RUQ (LIVER/GB) Result Date: 02/20/2024 CLINICAL DATA:  Transaminitis EXAM: ULTRASOUND ABDOMEN LIMITED RIGHT UPPER QUADRANT COMPARISON:  Noncontrast CT 02/12/2017 FINDINGS: Gallbladder: Gallbladder is distended. No wall thickening, adjacent fluid. No reported Murphy's sign. Some echogenic lobular nonshadowing foci identified dependently, possible tumefactive sludge. Common bile duct: Diameter: 2 mm Liver: No focal lesion identified. Within normal limits in parenchymal echogenicity. Portal vein is patent on color Doppler imaging with normal direction of blood flow  towards the liver. Other: Small right-sided pleural effusion seen. IMPRESSION: Tumefactive sludge. No shadowing stones, wall thickening or ductal dilatation. Small right pleural effusion. Electronically Signed   By: Adrianna Horde M.D.   On: 02/20/2024 11:06   DG Chest 1 View Result Date: 02/20/2024 CLINICAL DATA:  Attempted left thoracentesis. EXAM: CHEST  1 VIEW COMPARISON:  Feb 19, 2023. FINDINGS: No definite pneumothorax is noted. Bilateral pleural effusions are again noted, left greater than right. IMPRESSION: Bilateral pleural effusions as noted above. Electronically Signed   By: Rosalene Colon M.D.   On: 02/20/2024 09:13   CT Angio Chest PE W and/or Wo Contrast Result Date: 02/19/2024 CLINICAL DATA:  Dyspnea. EXAM: CT ANGIOGRAPHY CHEST WITH CONTRAST TECHNIQUE: Multidetector CT imaging of the chest was performed using the standard protocol during bolus administration of intravenous contrast. Multiplanar CT image reconstructions and MIPs were obtained to evaluate the vascular anatomy. RADIATION DOSE REDUCTION: This exam was performed according to the departmental dose-optimization program which includes automated exposure control, adjustment of the mA and/or kV  according to patient size and/or use of iterative reconstruction technique. CONTRAST:  50mL OMNIPAQUE IOHEXOL 350 MG/ML SOLN COMPARISON:  January 26, 2017. FINDINGS: Cardiovascular: Moderate cardiomegaly is noted. No pericardial effusion. Coronary artery calcifications are noted. Atherosclerosis of thoracic aorta is noted. 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 There is no evidence of large central pulmonary embolus seen in main pulmonary artery or proximal portions of left and right pulmonary arteries. There is limited opacification of the lower lobe branches bilaterally potentially due to timing of contrast bolus as well as surrounding atelectasis. Smaller and more peripheral pulmonary emboli these areas cannot be excluded. Mediastinum/Nodes: No enlarged mediastinal, hilar, or axillary lymph nodes. Thyroid  gland, trachea, and esophagus demonstrate no significant findings. Lungs/Pleura: No pneumothorax is noted. Moderate bilateral pleural effusions are noted with associated atelectasis of both lower lobes. Upper Abdomen: No acute abnormality. Musculoskeletal: No chest wall abnormality. No acute or significant osseous findings. Review of the MIP images confirms the above findings. IMPRESSION: No definite evidence of large central pulmonary embolus seen in main pulmonary artery or proximal portions of left and right pulmonary arteries. However, there is very limited opacification of the lower lobe branches of both pulmonary arteries potentially due to timing of contrast bolus as well as surrounding atelectasis, and therefore smaller and more peripheral pulmonary emboli cannot be excluded on the basis of this exam. 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 Recommend annual imaging followup by CTA or MRA. This recommendation follows 2010 ACCF/AHA/AATS/ACR/ASA/SCA/SCAI/SIR/STS/SVM Guidelines for the Diagnosis and Management of Patients with Thoracic Aortic Disease. Circulation. 2010; 121: N562-Z308. Aortic  aneurysm NOS (ICD10-I71.9) Moderate bilateral pleural effusions are noted with adjacent atelectasis of both lower lobes. Enlarged main pulmonary artery is noted suggesting pulmonary artery hypertension. Moderate cardiomegaly. Coronary artery calcifications are noted suggesting coronary artery disease. Aortic Atherosclerosis (ICD10-I70.0). Electronically Signed   By: Rosalene Colon M.D.   On: 02/19/2024 14:05   DG Chest Port 1 View Result Date: 02/19/2024 CLINICAL DATA:  [id number] SOB (shortness of breath) [id number] EXAM: PORTABLE CHEST - 1 VIEW COMPARISON:  None available. FINDINGS: Retrocardiac airspace consolidation with blunting of the costophrenic sulcus. Blunting of the right costophrenic angle with patchy right basilar airspace opacities, also present. No pneumothorax. Skin fold artifact along the right lung. Mild cardiomegaly. Tortuous aorta with aortic atherosclerosis. No acute fracture or destructive lesions. Multilevel thoracic osteophytosis. Osteopenia. IMPRESSION: Small bilateral pleural effusions, larger on the left than the right, with  bibasilar airspace opacities, likely atelectasis. Electronically Signed   By: Rance Burrows M.D.   On: 02/19/2024 10:23     LOS: 1 day   Kimberly Penna, MD  [facility]    To contact the attending provider between 7A-7P or the covering provider during after hours 7P-7A, please log into the web site [email or web address] and access using universal [place] password for that web site. If you do not have the password, please call the hospital operator.  02/21/2024, 8:29 AM

## 2024-02-21 NOTE — Plan of Care (Signed)
  Problem: Education: Goal: Knowledge of General Education information will improve Description: Including pain rating scale, medication(s)/side effects and non-pharmacologic comfort measures Outcome: Progressing   Problem: Health Behavior/Discharge Planning: Goal: Ability to manage health-related needs will improve Outcome: Progressing   Problem: Clinical Measurements: Goal: Respiratory complications will improve Outcome: Progressing   Problem: Activity: Goal: Risk for activity intolerance will decrease Outcome: Progressing   Problem: Nutrition: Goal: Adequate nutrition will be maintained Outcome: Progressing   Problem: Coping: Goal: Level of anxiety will decrease Outcome: Progressing   Problem: Safety: Goal: Ability to remain free from injury will improve Outcome: Progressing   Problem: Pain Managment: Goal: General experience of comfort will improve and/or be controlled Outcome: Progressing

## 2024-02-22 ENCOUNTER — Inpatient Hospital Stay (HOSPITAL_COMMUNITY)

## 2024-02-22 DIAGNOSIS — I509 Heart failure, unspecified: Secondary | ICD-10-CM

## 2024-02-22 DIAGNOSIS — I5020 Unspecified systolic (congestive) heart failure: Secondary | ICD-10-CM | POA: Diagnosis not present

## 2024-02-22 DIAGNOSIS — R7989 Other specified abnormal findings of blood chemistry: Secondary | ICD-10-CM | POA: Diagnosis not present

## 2024-02-22 DIAGNOSIS — J9601 Acute respiratory failure with hypoxia: Secondary | ICD-10-CM | POA: Diagnosis not present

## 2024-02-22 DIAGNOSIS — I471 Supraventricular tachycardia, unspecified: Secondary | ICD-10-CM

## 2024-02-22 LAB — COMPREHENSIVE METABOLIC PANEL WITH GFR
ALT: 278 U/L — ABNORMAL HIGH (ref 0–44)
AST: 79 U/L — ABNORMAL HIGH (ref 15–41)
Albumin: 2.9 g/dL — ABNORMAL LOW (ref 3.5–5.0)
Alkaline Phosphatase: 26 U/L — ABNORMAL LOW (ref 38–126)
Anion gap: 10 (ref 5–15)
BUN: 31 mg/dL — ABNORMAL HIGH (ref 8–23)
CO2: 29 mmol/L (ref 22–32)
Calcium: 8.9 mg/dL (ref 8.9–10.3)
Chloride: 100 mmol/L (ref 98–111)
Creatinine, Ser: 1.65 mg/dL — ABNORMAL HIGH (ref 0.44–1.00)
GFR, Estimated: 31 mL/min — ABNORMAL LOW (ref >60)
Glucose, Bld: 94 mg/dL (ref 70–99)
Potassium: 3.2 mmol/L — ABNORMAL LOW (ref 3.5–5.1)
Sodium: 139 mmol/L (ref 135–145)
Total Bilirubin: 1 mg/dL (ref 0.0–1.2)
Total Protein: 5.6 g/dL — ABNORMAL LOW (ref 6.5–8.1)

## 2024-02-22 LAB — URINE CULTURE: Culture: <10000 — AB

## 2024-02-22 LAB — MAGNESIUM: Magnesium: 2.1 mg/dL (ref 1.7–2.4)

## 2024-02-22 MED ORDER — REGADENOSON 0.4 MG/5ML IV SOLN
INTRAVENOUS | Status: AC
  Filled 2024-02-22: qty 5

## 2024-02-22 MED ORDER — NITROGLYCERIN 0.4 MG SL SUBL
SUBLINGUAL_TABLET | SUBLINGUAL | Status: AC
  Filled 2024-02-22: qty 1

## 2024-02-22 MED ORDER — HYDRALAZINE HCL 50 MG PO TABS
50.0000 mg | ORAL_TABLET | Freq: Three times a day (TID) | ORAL | Status: DC
  Administered 2024-02-22: 50 mg via ORAL
  Filled 2024-02-22: qty 1

## 2024-02-22 MED ORDER — AMINOPHYLLINE 25 MG/ML IV SOLN
INTRAVENOUS | Status: AC
  Filled 2024-02-22: qty 10

## 2024-02-22 MED ORDER — GADOBUTROL 1 MMOL/ML IV SOLN
10.0000 mL | Freq: Once | INTRAVENOUS | Status: AC | PRN
Start: 2024-02-22 — End: 2024-02-22
  Administered 2024-02-22: 10 mL via INTRAVENOUS

## 2024-02-22 MED ORDER — SACUBITRIL-VALSARTAN 24-26 MG PO TABS
1.0000 | ORAL_TABLET | Freq: Two times a day (BID) | ORAL | Status: DC
  Administered 2024-02-22 – 2024-02-24 (×4): 1 via ORAL
  Filled 2024-02-22 (×4): qty 1

## 2024-02-22 MED ORDER — REGADENOSON 0.4 MG/5ML IV SOLN
0.4000 mg | Freq: Once | INTRAVENOUS | Status: AC
Start: 2024-02-22 — End: 2024-02-22
  Administered 2024-02-22: 0.4 mg via INTRAVENOUS
  Filled 2024-02-22: qty 5

## 2024-02-22 MED ORDER — ALBUTEROL SULFATE HFA 108 (90 BASE) MCG/ACT IN AERS
INHALATION_SPRAY | RESPIRATORY_TRACT | Status: AC
  Filled 2024-02-22: qty 6.7

## 2024-02-22 MED ORDER — FUROSEMIDE 40 MG PO TABS
40.0000 mg | ORAL_TABLET | Freq: Every day | ORAL | Status: DC
  Administered 2024-02-23 – 2024-02-24 (×2): 40 mg via ORAL
  Filled 2024-02-22 (×2): qty 1

## 2024-02-22 MED ORDER — POTASSIUM CHLORIDE CRYS ER 20 MEQ PO TBCR
40.0000 meq | EXTENDED_RELEASE_TABLET | ORAL | Status: AC
  Administered 2024-02-22 (×2): 40 meq via ORAL
  Filled 2024-02-22 (×2): qty 2

## 2024-02-22 MED ORDER — METOPROLOL TARTRATE 5 MG/5ML IV SOLN
INTRAVENOUS | Status: AC
  Filled 2024-02-22: qty 5

## 2024-02-22 NOTE — Plan of Care (Signed)

## 2024-02-22 NOTE — TOC Progression Note (Signed)
 Transition of Care [facility]) - Progression Note    Patient Details  Name: Regina Bell MRN: [id number] Date of Birth: 1941/06/29  [facility] CM/SW Contact  Eusebio High, RN Phone Number: 02/22/2024, 11:47 AM  Clinical Narrative:     Patient being recommended OPPT RNCM has made referral to [facility]. AVS updated No additional recommendations TOC will continue to follow patient for any additional discharge needs . Patient will DC to home where she lives alone and is independent   Expected Discharge Plan and Services                                               Social Determinants of Health (SDOH) Interventions SDOH Screenings   Food Insecurity: No Food Insecurity (02/20/2024)  Housing: Low Risk  (02/21/2024)  Transportation Needs: No Transportation Needs (02/20/2024)  Utilities: Not At Risk (02/20/2024)  Financial Resource Strain: Low Risk  (02/21/2024)  Social Connections: Socially Integrated (02/21/2024)  Tobacco Use: Medium Risk (02/19/2024)    Readmission Risk Interventions     No data to display

## 2024-02-22 NOTE — Progress Notes (Addendum)
 Rounding Note    Patient Name: Regina Bell Date of Encounter: 02/22/2024  [facility] Cardiologist: Maudine Sos, MD   Subjective   Patient went into SVT with rates in the 160s last night and required Adenosine. She was completely asymptomatic with this episode and did well the rest of the night. She states she "slept like a log." Her shortness of breath has resolved. No chest pain, palpitations, lightheadedness, or dizziness.  Inpatient Medications    Scheduled Meds:  dapagliflozin propanediol  10 mg Oral Daily   furosemide  40 mg Oral BID   heparin   5,000 Units Subcutaneous Q8H   hydrALAZINE  25 mg Oral Q8H   isosorbide mononitrate  30 mg Oral Daily   metoprolol succinate  12.5 mg Oral Daily   potassium chloride  40 mEq Oral Q4H   sodium chloride  flush  3 mL Intravenous Q12H   Continuous Infusions:  cefTRIAXone (ROCEPHIN)  IV Stopped (02/21/24 1057)   PRN Meds: acetaminophen  **OR** acetaminophen , polyethylene glycol   Vital Signs    Vitals:   02/22/24 0400 02/22/24 0500 02/22/24 0725 02/22/24 0800  BP: 139/65 (!) 145/60 (!) 158/71 (!) 154/75  Pulse: 71 62 68 64  Resp:   18   Temp:   98.3 F (36.8 C)   TempSrc:   Oral   SpO2: 95% 93% 95% 93%  Weight:      Height:        Intake/Output Summary (Last 24 hours) at 02/22/2024 0922 Last data filed at 02/22/2024 0700 Gross per 24 hour  Intake 220.11 ml  Output --  Net 220.11 ml      02/22/2024    3:50 AM 02/21/2024    6:21 AM 02/20/2024    5:00 AM  Last 3 Weights  Weight (lbs) 180 lb 1.9 oz 180 lb 1.9 oz 188 lb 4.4 oz  Weight (kg) 81.7 kg 81.7 kg 85.4 kg      Telemetry    Normal sinus rhythm with rates in the 60s. Frequent PACs and PVCs as well as a 9.8 second run of a narrow complex irregular tachycardia with rates in the 130s (looks more irregular than when she was in SVT last night). - Personally Reviewed  ECG    Normal sinus rhythm, rate 63 bpm, with PACs and PVCs and diffuse mild T  wave inversions. - Personally Reviewed  Physical Exam   GEN: No acute distress.   Neck: No JVD. Cardiac: RRR with some ectopy. No murmurs, rubs, or gallops.  Respiratory: No increased work of breathing. Clear to auscultation bilaterally. MS: No lower extremity edema. No deformity. Skin: Warm and dry. Neuro:  No focal deficits. Psych: Normal affect. Responds appropriately.   Labs    High Sensitivity Troponin:  No results for input(s): "TROPONINIHS" in the last 720 hours.   Chemistry Recent Labs  Lab 02/20/24 0428 02/21/24 0435 02/22/24 0402  NA 140 141 139  K 3.5 3.6 3.2*  CL 105 99 100  CO2 26 31 29   GLUCOSE 106* 90 94  BUN 36* 30* 31*  CREATININE 1.68* 1.73* 1.65*  CALCIUM 8.7* 9.0 8.9  MG 1.9  --  2.1  PROT 5.8* 6.0* 5.6*  ALBUMIN 2.9* 3.0* 2.9*  AST 150* 171* 79*  ALT 377* 403* 278*  ALKPHOS 25* 29* 26*  BILITOT 0.9 1.1 1.0  GFRNONAA 30* 29* 31*  ANIONGAP 9 11 10     Lipids No results for input(s): "CHOL", "TRIG", "HDL", "LABVLDL", "LDLCALC", "CHOLHDL" in the  last 168 hours.  Hematology Recent Labs  Lab 02/19/24 0933 02/20/24 0428 02/21/24 0435  WBC 9.2 8.5 8.7  RBC 4.44 4.22 4.38  HGB 13.4 12.7 13.3  HCT 41.2 39.3 40.5  MCV 92.8 93.1 92.5  MCH 30.2 30.1 30.4  MCHC 32.5 32.3 32.8  RDW 14.1 14.1 13.8  PLT 209 201 202   Thyroid  No results for input(s): "TSH", "FREET4" in the last 168 hours.  BNP Recent Labs  Lab 02/19/24 0933  PROBNP >35,000.0*    DDimer  Recent Labs  Lab 02/19/24 0932  DDIMER 8.48*     Radiology    ECHOCARDIOGRAM COMPLETE Result Date: 02/20/2024    ECHOCARDIOGRAM REPORT   Patient Name:   Regina Bell Date of Exam: 02/20/2024 Medical Rec #:  [id number]         Height:       65.0 in Accession #:    [id number]        Weight:       188.3 lb Date of Birth:  09-05-1941          BSA:          1.928 m Patient Age:    83 years          BP:           185/92 mmHg Patient Gender: F                 HR:           80 bpm. Exam Location:   Inpatient Procedure: 2D Echo, Cardiac Doppler, Color Doppler and Intracardiac            Opacification Agent (Both Spectral and Color Flow Doppler were            utilized during procedure). Indications:    CHF  History:        Patient has no prior history of Echocardiogram examinations.                 CHF.  Sonographer:    Astrid Blamer Referring Phys: [id number] Gayl Katos MELVIN IMPRESSIONS  1. Left ventricular ejection fraction, by estimation, is 20 to 25%. The left ventricle has severely decreased function. The left ventricle demonstrates global hypokinesis. There is moderate concentric left ventricular hypertrophy. Left ventricular diastolic parameters are consistent with Grade III diastolic dysfunction (restrictive).  2. Right ventricular systolic function is moderately reduced. The right ventricular size is normal. There is normal pulmonary artery systolic pressure. The estimated right ventricular systolic pressure is 33.3 mmHg.  3. Left atrial size was severely dilated.  4. Right atrial size was mildly dilated.  5. Moderate pleural effusion in the left lateral region.  6. The mitral valve is degenerative. Mild to moderate mitral valve regurgitation. Mild mitral stenosis.  7. The aortic valve is tricuspid. There is mild calcification of the aortic valve. Aortic valve regurgitation is mild. Aortic valve sclerosis/calcification is present, without any evidence of aortic stenosis.  8. The inferior vena cava is normal in size with greater than 50% respiratory variability, suggesting right atrial pressure of 3 mmHg. Conclusion(s)/Recommendation(s): Consider evalaution for infiltrative cardiomopathy, if clinically indicated. FINDINGS  Left Ventricle: Left ventricular ejection fraction, by estimation, is 20 to 25%. The left ventricle has severely decreased function. The left ventricle demonstrates global hypokinesis. Definity contrast agent was given IV to delineate the left ventricular endocardial borders. The left  ventricular internal cavity size was normal in size. There is moderate concentric left ventricular hypertrophy.  Left ventricular diastolic parameters are consistent with Grade III diastolic dysfunction (restrictive). Right Ventricle: The right ventricular size is normal. No increase in right ventricular wall thickness. Right ventricular systolic function is moderately reduced. There is normal pulmonary artery systolic pressure. The tricuspid regurgitant velocity is 2.66 m/s, and with an assumed right atrial pressure of 5 mmHg, the estimated right ventricular systolic pressure is 33.3 mmHg. Left Atrium: Left atrial size was severely dilated. Right Atrium: Right atrial size was mildly dilated. Pericardium: There is no evidence of pericardial effusion. Mitral Valve: The mitral valve is degenerative in appearance. There is moderate calcification of the mitral valve leaflet(s). Mild to moderate mitral valve regurgitation. Mild mitral valve stenosis. MV peak gradient, 6.7 mmHg. The mean mitral valve gradient is 3.0 mmHg. Tricuspid Valve: The tricuspid valve is normal in structure. Tricuspid valve regurgitation is mild . No evidence of tricuspid stenosis. Aortic Valve: The aortic valve is tricuspid. There is mild calcification of the aortic valve. Aortic valve regurgitation is mild. Aortic valve sclerosis/calcification is present, without any evidence of aortic stenosis. Aortic valve mean gradient measures 2.0 mmHg. Aortic valve peak gradient measures 4.2 mmHg. Aortic valve area, by VTI measures 2.51 cm. Pulmonic Valve: The pulmonic valve was normal in structure. Pulmonic valve regurgitation is mild. No evidence of pulmonic stenosis. Aorta: The aortic root is normal in size and structure. Venous: The inferior vena cava is normal in size with greater than 50% respiratory variability, suggesting right atrial pressure of 3 mmHg. IAS/Shunts: No atrial level shunt detected by color flow Doppler. Additional Comments: There is a  moderate pleural effusion in the left lateral region.  LEFT VENTRICLE PLAX 2D LVIDd:         5.40 cm   Diastology LVIDs:         4.90 cm   LV e' medial:    2.28 cm/s LV PW:         1.20 cm   LV E/e' medial:  60.1 LV IVS:        1.00 cm   LV e' lateral:   4.90 cm/s LVOT diam:     1.90 cm   LV E/e' lateral: 28.0 LV SV:         38 LV SV Index:   20 LVOT Area:     2.84 cm  RIGHT VENTRICLE RV S prime:     9.79 cm/s TAPSE (M-mode): 1.3 cm LEFT ATRIUM              Index        RIGHT ATRIUM           Index LA Vol (A2C):   90.2 ml  46.78 ml/m  RA Area:     19.20 cm LA Vol (A4C):   129.0 ml 66.91 ml/m  RA Volume:   43.30 ml  22.46 ml/m LA Biplane Vol: 109.0 ml 56.54 ml/m  AORTIC VALVE AV Area (Vmax):    2.06 cm AV Area (Vmean):   2.24 cm AV Area (VTI):     2.51 cm AV Vmax:           102.00 cm/s AV Vmean:          64.000 cm/s AV VTI:            0.150 m AV Peak Grad:      4.2 mmHg AV Mean Grad:      2.0 mmHg LVOT Vmax:         74.10 cm/s LVOT Vmean:  50.600 cm/s LVOT VTI:          0.133 m LVOT/AV VTI ratio: 0.89  AORTA Ao Root diam: 3.40 cm MITRAL VALVE                TRICUSPID VALVE MV Area (PHT): 3.87 cm     TR Peak grad:   28.3 mmHg MV Area VTI:   1.30 cm     TR Vmax:        266.00 cm/s MV Peak grad:  6.7 mmHg MV Mean grad:  3.0 mmHg     SHUNTS MV Vmax:       1.29 m/s     Systemic VTI:  0.13 m MV Vmean:      75.8 cm/s    Systemic Diam: 1.90 cm MV Decel Time: 196 msec MV E velocity: 137.00 cm/s MV A velocity: 77.40 cm/s MV E/A ratio:  1.77 Jules Oar MD Electronically signed by Jules Oar MD Signature Date/Time: 02/20/2024/6:47:21 PM    Final    VAS US  LOWER EXTREMITY VENOUS (DVT) Result Date: 02/20/2024  Lower Venous DVT Study Patient Name:  Regina Bell  Date of Exam:   02/20/2024 Medical Rec #: [id number]          Accession #:    [id number] Date of Birth: 07/07/1941           Patient Gender: F Patient Age:   36 years Exam Location:  [facility] Procedure:      VAS US  LOWER EXTREMITY  VENOUS (DVT) Referring Phys: Kimberly Penna --------------------------------------------------------------------------------  Indications: Pulmonary embolism, and SOB.  Risk Factors: Confirmed PE. Comparison Study: No priors. Performing Technologist: Franky Ivanoff Sturdivant-Jones RDMS, RVT  Examination Guidelines: A complete evaluation includes B-mode imaging, spectral Doppler, color Doppler, and power Doppler as needed of all accessible portions of each vessel. Bilateral testing is considered an integral part of a complete examination. Limited examinations for reoccurring indications may be performed as noted. The reflux portion of the exam is performed with the patient in reverse Trendelenburg.  +---------+---------------+---------+-----------+----------+--------------+ RIGHT    CompressibilityPhasicitySpontaneityPropertiesThrombus Aging +---------+---------------+---------+-----------+----------+--------------+ CFV      Full           Yes      Yes                                 +---------+---------------+---------+-----------+----------+--------------+ SFJ      Full                                                        +---------+---------------+---------+-----------+----------+--------------+ FV Prox  Full                                                        +---------+---------------+---------+-----------+----------+--------------+ FV Mid   Full                                                        +---------+---------------+---------+-----------+----------+--------------+ FV  DistalFull                                                        +---------+---------------+---------+-----------+----------+--------------+ PFV      Full                                                        +---------+---------------+---------+-----------+----------+--------------+ POP      Full           Yes      Yes                                  +---------+---------------+---------+-----------+----------+--------------+ PTV      Full                                                        +---------+---------------+---------+-----------+----------+--------------+ PERO     Full                                                        +---------+---------------+---------+-----------+----------+--------------+ 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  +---------+---------------+---------+-----------+----------+--------------+ LEFT     CompressibilityPhasicitySpontaneityPropertiesThrombus Aging +---------+---------------+---------+-----------+----------+--------------+ CFV      Full           Yes      Yes                                 +---------+---------------+---------+-----------+----------+--------------+ SFJ      Full                                                        +---------+---------------+---------+-----------+----------+--------------+ FV Prox  Full                                                        +---------+---------------+---------+-----------+----------+--------------+ FV Mid   Full                                                        +---------+---------------+---------+-----------+----------+--------------+ FV DistalFull                                                        +---------+---------------+---------+-----------+----------+--------------+  PFV      Full                                                        +---------+---------------+---------+-----------+----------+--------------+ POP      Full           Yes      Yes                                 +---------+---------------+---------+-----------+----------+--------------+ PTV      Full                                                        +---------+---------------+---------+-----------+----------+--------------+ PERO     Full                                                         +---------+---------------+---------+-----------+----------+--------------+ A large complex, hypoechoic non vascularized structure located in the left popliteal fossa measuring 8.2 x 6.0 x 4.8 cm. etiology is unknown.    Summary: BILATERAL: - No evidence of deep vein thrombosis seen in the lower extremities, bilaterally. -No evidence of popliteal cyst, bilaterally.   *See table(s) above for measurements and observations. Electronically signed by Jimmye Moulds MD on 02/20/2024 at 2:16:11 PM.    Final    IR THORACENTESIS ASP PLEURAL SPACE W/IMG GUIDE Result Date: 02/20/2024 INDICATION: 83 year old female with bilateral pleural effusions with left being greater than right for left diagnostic thoracentesis. EXAM: ULTRASOUND GUIDED LEFT THORACENTESIS MEDICATIONS: 10 mL 1% lidocaine COMPLICATIONS: None immediate. PROCEDURE: An ultrasound guided thoracentesis was thoroughly discussed with the patient and questions answered. The benefits, risks, alternatives and complications were also discussed. The patient understands and wishes to proceed with the procedure. Written consent was obtained. Ultrasound was performed to localize and mark an adequate pocket of fluid in the left chest. The area was then prepped and draped in the normal sterile fashion. 1% Lidocaine was used for local anesthesia. Under ultrasound guidance a 6 Fr Safe-T-Centesis catheter was introduced. Thoracentesis was performed. The catheter was removed and a dressing applied. FINDINGS: A total of approximately 0 mL of clear, red fluid was removed. Samples were sent to the laboratory as requested by the clinical team. IMPRESSION: Successful ultrasound guided left thoracentesis yielding 0 mL of pleural fluid. Performed By Lorinda Root, PA-C Electronically Signed   By: Myrlene Asper D.O.   On: 02/20/2024 13:00   US  RENAL Result Date: 02/20/2024 CLINICAL DATA:  Acute kidney insufficiency EXAM: RENAL / URINARY TRACT ULTRASOUND  COMPLETE COMPARISON:  Renal ultrasound 02/07/2017 FINDINGS: Right Kidney: Renal measurements: 11.8 x 4.9 x 6.3 cm = volume: 192.0 mL. No collecting system dilatation. Echogenic shadowing foci in the right kidney measuring 14 mm consistent with stones. Left Kidney: Renal measurements: 9.8 x 4.7 x 5.1 cm = volume: 122.6 mL. No collecting system dilatation. Echogenic shadowing areas in left kidney as well consistent with stones measuring up  to 15 mm. Bladder: Appears normal for degree of bladder distention. Other: Bilateral small pleural effusions. IMPRESSION: Bilateral nonobstructing renal stones. No collecting system dilatation. Electronically Signed   By: Adrianna Horde M.D.   On: 02/20/2024 11:08   US  Abdomen Limited RUQ (LIVER/GB) Result Date: 02/20/2024 CLINICAL DATA:  Transaminitis EXAM: ULTRASOUND ABDOMEN LIMITED RIGHT UPPER QUADRANT COMPARISON:  Noncontrast CT 02/12/2017 FINDINGS: Gallbladder: Gallbladder is distended. No wall thickening, adjacent fluid. No reported Murphy's sign. Some echogenic lobular nonshadowing foci identified dependently, possible tumefactive sludge. Common bile duct: Diameter: 2 mm Liver: No focal lesion identified. Within normal limits in parenchymal echogenicity. Portal vein is patent on color Doppler imaging with normal direction of blood flow towards the liver. Other: Small right-sided pleural effusion seen. IMPRESSION: Tumefactive sludge. No shadowing stones, wall thickening or ductal dilatation. Small right pleural effusion. Electronically Signed   By: Adrianna Horde M.D.   On: 02/20/2024 11:06    Cardiac Studies   Echocardiogram 02/20/2024: Impressions:  1. Left ventricular ejection fraction, by estimation, is 20 to 25%. The  left ventricle has severely decreased function. The left ventricle  demonstrates global hypokinesis. There is moderate concentric left  ventricular hypertrophy. Left ventricular  diastolic parameters are consistent with Grade III diastolic  dysfunction  (restrictive).   2. Right ventricular systolic function is moderately reduced. The right  ventricular size is normal. There is normal pulmonary artery systolic  pressure. The estimated right ventricular systolic pressure is 33.3 mmHg.   3. Left atrial size was severely dilated.   4. Right atrial size was mildly dilated.   5. Moderate pleural effusion in the left lateral region.   6. The mitral valve is degenerative. Mild to moderate mitral valve  regurgitation. Mild mitral stenosis.   7. The aortic valve is tricuspid. There is mild calcification of the  aortic valve. Aortic valve regurgitation is mild. Aortic valve  sclerosis/calcification is present, without any evidence of aortic  stenosis.   8. The inferior vena cava is normal in size with greater than 50%  respiratory variability, suggesting right atrial pressure of 3 mmHg.    Patient Profile     83 y.o. female with a history of PE in 2018 no longer on anticoagulation but no known cardiac history prior to this admission. She was admitted on 02/19/2024 with new onset acute CHF after presenting with shortness of breath. Echo showed biventricular failure. Cardiology consulted for further management of this.  Assessment & Plan    Acute Combined CHF Biventricular Failure Patient presented to the ED with shortness of breath. Pro-BNP >35,000. Chest CTA negative for PE but showed moderate bilateral pleural effusion and enlarged main pulmonary artery suggesting pulmonary artery hypertension. Echo showed LVEF of 20-25% with global hypokinesis, moderate LVH, and grade 3 diastolic dysfunction as well as moderately reduced RV function, mild to moderate MR/ mild MS, and mild AI. PASP was normal. She was started on IV Lasix and then switched to PO Lasix on 5/15. Thoracentesis was attempted on 5/14 but no fluid was drained. Urinary output has not been documented. Weight down 8 lbs from admission. Renal function stable. - Does not appear  significantly volume overloaded one xam.  - Continue PO Lasix 40mg  twice daily.  - GDMT limited by renal function.  - Continue Toprol-XL 12.5mg  daily. - Continue Farxiga 10mg  daily.  - Continue Hydralazine 25mg  three times daily and Imdur 30mg  daily. We have BP room to up-titrate these but will wait until after cardiac MRI. - Not on ACEI/ARB/  ARNI or MRA at this time given renal function.  - Plan is for stress MRI today to rule out ischemia and evaluate for infiltrative cardiomyopathy.  SVT Patient went into SVT on evening of 5/15 with rates in the 160s. BP was soft with this. She was given Adenosine (6mg  and then 12mg ) with restoration of sinus rhythm and was then started on a low dose beta-blocker.  - Reviewed telemetry - she continues to have frequent PACs and PVCs. She also had a very short run (about 9.8 seconds) of a narrow complex irregular tachycardia this morning around 6am. This looked more irregular than the episode of SVT last night and was not as fast.  - Potassium 3.2 this morning. Being repleted by primary team.  - Magnesium 2.1 today. - Will check TSH. - Continue Toprol-XL 12.5mg  daily.  Elevated Troponin High-sensitivity troponin 288 >> 286. Echo as above. - No chest pain.  - Troponin elevation likely demand ischemia in setting of acute CHF. However, plan is for stress MRI today as above.  Hypertension BP was soft at times yesterday but she has mostly been hypertensive this admission.  - Continue medications for CHF as above.  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  - Will need to continue to follow this as an outpatient.  CKD Stage IIIb Creatinine 1.68 on admission. Last known creatinine was 0.92 in 2018 but suspect this is likely CKD rather than AKI. - Creatinine stable at 1.65 today. GFR 31.  - Continue to monitor closely.   Transaminitis Likely due to hepatic congestion. RUQ ultrasound showed  tumefactive sludge but no shadowing stone, wall thickening, or ductal dilatation.  - LFTs still elevated but slowly improving. AST 79 and ALT 278 today.  - Continue to monitor.   Hypokalemia Potassium 3.2 today.  - Being repleted by primary team.   For questions or updates, please contact [facility] Please consult [email or web address] for contact info under        Signed, Callie E Goodrich, PA-C  02/22/2024, 9:22 AM     Attending Addendum:  History and all data above reviewed.  Patient examined.  I agree with the findings as above.  The patient exam reveals:  VS:  BP (!) 154/75   Pulse 64   Temp 98.3 F (36.8 C) (Oral)   Resp 18   Ht 5\' 5"  (1.651 m)   Wt 81.7 kg   SpO2 93%   BMI 29.97 kg/m  , BMI Body mass index is 29.97 kg/m. GENERAL:  Well appearing HEENT: Pupils equal round and reactive, fundi not visualized, oral mucosa unremarkable NECK:  No jugular venous distention, waveform within normal limits, carotid upstroke brisk and symmetric, no bruits, no thyromegaly LUNGS:  Clear to auscultation bilaterally HEART:  RRR.  PMI not displaced or sustained,S1 and S2 within normal limits, no S3, no S4, no clicks, no rubs, no murmurs ABD:  Flat, positive bowel sounds normal in frequency in pitch, no bruits, no rebound, no guarding, no midline pulsatile mass, no hepatomegaly, no splenomegaly EXT:  2 plus pulses throughout, no edema, no cyanosis no clubbing SKIN:  No rashes no nodules NEURO:  Cranial nerves II through XII grossly intact, motor grossly intact throughout PSYCH:  Cognitively intact, oriented to person place and time   All available labs, radiology testing, previous records reviewed. Agree with documented assessment and plan. Ms. Mcculloh is an 55F with prior PE admitted with shortness of breath.  Found to  have acute HFrEF and RV failure.     # Acute HFrEF:  # RV Failure:  # Mitral stenosis: She has no known cardiac history.  No recent viral illness.  Pro-BNP  >35,000.  Troponin T 288.  Echo revealed LVEF 20-25% with global hypokinesis and Grade 3 diastolic dysfunction.  RV function moderately reduced.  She has mild mitral stenosis.  She has no ischemic symptoms.  No family history of cardiac disease/heart failure.  Renal function is also impaired.  No recent baseline available.  Therefore we will not get a LHC.  She go for stress MRI today to rule out ischemia and evaluate for infiltrative disease/source of cardiomyopathy.  Continue Farxiga and lasix. Renal function prohibits further GDMT at this time.     #  SVT:  She had an episode of SVT overnight.  She was completely asymptomatic with rates were in the 160s.  This broke with adenosine.  This morning potassium was 3.2.  This will be supplemented.  She was also started on metoprolol.  # Elevated troponin: Likely demand ischemia in the setting of acute heart failure.  Planning for stress MRI today as above.   # Hypertension:  BP uncontrolled.  This may have contributed to her cardiomyopathy, though only mild LVH on echo (1.2cm). Given renal dysfunction, will start with hydralazine and nitrates.  Avoiding beta blocker 2/2 bradycardia.     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  BP control as above.  Annual surveillance outpatient.    # Transaminitis:  Likely 2/2 hepatic congestion. Improving.      Kensleigh Gates C. Theodis Fiscal, MD, [facility]  02/22/2024 9:46 AM

## 2024-02-22 NOTE — Progress Notes (Addendum)
   Heart Failure Stewardship Pharmacist Progress Note   PCP: Ozell Blunt, MD PCP-Cardiologist: Maudine Sos, MD    HPI:  83 yo F with PMH of PE.   Presented to the ED on 5/13 with shortness of breath x 1 week and fatigue. proBNP >35k. Trop 161>096. D-dimer elevated 8.48. CXR with small bilateral pleural effusions. CTA negative for large PE but limited due to contrast timing and surrounding atelectasis. ECHO on 5/14 with LVEF reduced to 20-25%, global hypokinesis, moderate concentric LVH, G3DD, RV moderately reduced, mild to moderate MR. Attempted thoracentesis on 5/14 - no fluid collected. Pending stress test prior to discharge.   Overnight, had an episode of SVT requiring adenosine 6 mg x 1 and 12 mg x 1.   Denies shortness of breath. No LE edema. Only reporting feeling tired today. Reviewed changes to GDMT and copay information for Farxiga. Copays are affordable per patient. Agreeable to using [facility] TOC pharmacy at discharge.   Current HF Medications: Diuretic: furosemide 40 mg PO BID Beta Blocker: metoprolol XL 12.5 mg daily SGLT2i: Farxiga 10 mg daily Other: hydralazine 50 mg TID + Imdur 30 mg daily  Prior to admission HF Medications: None  Pertinent Lab Values: Serum creatinine 1.65, BUN 31, Potassium 3.2, Sodium 139, proBNP >35000, Magnesium 2.1  Vital Signs: Weight: 180 lbs (admission weight: 188 lbs) - bed weight Blood pressure: 150/80s  Heart rate: 60-80s  I/O: incomplete  Medication Assistance / Insurance Benefits Check: Does the patient have prescription insurance?  Yes Type of insurance plan: [facility] [employer]  Outpatient Pharmacy:  Prior to admission outpatient [facility] Is the patient willing to use [facility] TOC pharmacy at discharge? Yes Is the patient willing to transition their outpatient pharmacy to utilize a [facility] outpatient pharmacy?   No     Assessment: 1. Acute systolic CHF (LVEF 20-25%), due to unknown etiology, not planning cath per  cardiology but will get stress test before discharge. NYHA class II symptoms. - Continue furosemide 40 mg PO BID. Strict I/Os and daily weights. Keep K>4 and Mg>2. KCl 40 mEq x 2 ordered for replacement.  - Continue metoprolol XL 12.5 mg daily - Consider starting Entresto 24/26 mg BID and spironolactone 25 mg daily pending improvement in renal function. Unclear baseline. Continue hydralazine/nitrate pending improvement in renal function. - Continue adding Farxiga 10 mg daily. Noted on admission to have nitrates, leukocytes, and bacteria in urine. Was started on ceftriaxone for presumed UTI. Cultures with <10k colonies insignificant growth. Denies any symptoms of this. Now off antibiotics.    Plan: 1) Medication changes recommended at this time: - Continue current regimen - Add Entresto once renal function improves  2) Patient assistance: - $255 deductible remaining on insurance plan - Entresto copay once deductible is met $47 - Farxiga copay once deductible is met $47 - Copays affordable per patient  3)  Education  - Patient has been educated on current HF medications and potential additions to HF medication regimen - Patient verbalizes understanding that over the next few months, these medication doses may change and more medications may be added to optimize HF regimen - Patient has been educated on basic disease state pathophysiology and goals of therapy   Jerilyn Monte, PharmD, BCPS Heart Failure Engineer, building services Phone [phone]

## 2024-02-22 NOTE — Progress Notes (Signed)
 PROGRESS NOTE        PATIENT DETAILS Name: Regina Bell Age: 83 y.o. Sex: female Date of Birth: October 08, 1941 Admit Date: 02/19/2024 Admitting Physician Tylene Galla Donzell Gallery, MD JYN:[id number], Albin Altes, MD  Brief Summary: Patient is a 83 y.o.  female with history of pulmonary embolism-presented with shortness of breath-upon further evaluation-she was found to have new onset HFrEF with AKI and transaminitis.  Significant events: 5/13>> admit to [facility] 5/15>> SVT-aborted by adenosine 12 mg.  Significant studies: 5/13>> CTA chest: No large PE-Limited study of lower lobe branches, 4.2 cm ascending aortic aneurysm.  Moderate B/L pleural effusions, enlarged main pulmonary artery.  Moderate cardiomegaly.  Coronary artery calcifications. 5/14>> renal ultrasound: No hydronephrosis 5/14>> RUQ ultrasound: No cholecystitis-no shadowing stones-+ sludge. 5/14>> B/L lower extremity Doppler: No DVT 5/14>> echo: EF 20-25%, RV systolic function moderately reduced.  Grade 3 diastolic dysfunction.  Significant microbiology data: 5/13>> COVID/influenza/RSV PCR: Negative 5/13>> urine culture:<10,000 colonies/mL  Procedures: 5/14>> attempted thoracocentesis-dry tap.  Consults: Cardiology  Subjective: Less shortness of breath-lying flat.  Objective: Vitals: Blood pressure (!) 169/96, pulse 80, temperature 98.3 F (36.8 C), temperature source Oral, resp. rate 18, height 5\' 5"  (1.651 m), weight 81.7 kg, SpO2 95%.   Exam: Gen Exam:Alert awake-not in any distress HEENT:atraumatic, normocephalic Chest: B/L clear to auscultation anteriorly CVS:S1S2 regular Abdomen:soft non tender, non distended Extremities:no edema Neurology: Non focal Skin: no rash  Pertinent Labs/Radiology:    Latest Ref Rng & Units 02/21/2024    4:35 AM 02/20/2024    4:28 AM 02/19/2024    9:33 AM  CBC  WBC 4.0 - 10.5 K/uL 8.7  8.5  9.2   Hemoglobin 12.0 - 15.0 g/dL 57.8  46.9  62.9   Hematocrit  36.0 - 46.0 % 40.5  39.3  41.2   Platelets 150 - 400 K/uL 202  201  209     Lab Results  Component Value Date   NA 139 02/22/2024   K 3.2 (L) 02/22/2024   CL 100 02/22/2024   CO2 29 02/22/2024     Assessment/Plan: Acute hypoxic respiratory failure Secondary to new onset HFrEF Appears comfortable-either on room air or on just 2 L of oxygen Continue diuretics and wean oxygen as able.  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  AKI Likely hemodynamically mediated-potentially cardiorenal syndrome UA negative for proteinuria Renal ultrasound without hydronephrosis Creatinine is essentially unchanged and stable for the past several days. Continue supportive care Avoid nephrotoxic agents  Hypokalemia Replete/recheck.  Transaminitis Suspect secondary to hepatic congestion from biventricular heart failure. Acute hepatitis serology-negative RUQ ultrasound-without any significant abnormalities Continue to trend LFTs periodically-thankfully slowly downtrending with elevation of GDMT meds/diuretics.  B/L pleural effusion Dry tap on attempted thoracocentesis on 5/14 Etiology likely due to CHF-transudative physiology.  History of unprovoked PE 2018 No central PE seen on CTA B/L lower extremity Dopplers negative for DVT Per my discussion with patient on 5/14-looks like she completed 6 months of Xarelto  and then was taken off anticoagulation.  UTI Completed 3 days of Rocephin Urine culture nondiagnostic.  4.2 cm ascending aortic aneurysm Incidental finding on CTA chest Radiology recommending annual follow-up by CTA or MRA.  Class I obesity: Estimated body mass  index is 29.97 kg/m as calculated from the following:    Height as of this encounter: 5\' 5"  (1.651 m).   Weight as of this encounter: 81.7 kg.   Code status:   Code Status: Full Code   DVT Prophylaxis: heparin  injection 5,000 Units Start: 02/19/24 2200   Family Communication: Son [phone] updated over the phone on 5/16   Disposition Plan: Status is: Observation The patient will require care spanning > 2 midnights and should be moved to inpatient because: Severity of illness   Planned Discharge Destination:Home   Diet: Diet Order             Diet NPO time specified Except for: Sips with Meds  Diet effective midnight                     Antimicrobial agents: Anti-infectives (From admission, onward)    Start     Dose/Rate Route Frequency Ordered Stop   02/20/24 1000  cefTRIAXone (ROCEPHIN) 1 g in sodium chloride  0.9 % 100 mL IVPB        1 g 200 mL/hr over 30 Minutes Intravenous Every 24 hours 02/19/24 1726     02/19/24 1100  cefTRIAXone (ROCEPHIN) 1 g in sodium chloride  0.9 % 100 mL IVPB        1 g 200 mL/hr over 30 Minutes Intravenous  Once 02/19/24 1045 02/19/24 1200        MEDICATIONS: Scheduled Meds:  dapagliflozin propanediol  10 mg Oral Daily   furosemide  40 mg Oral BID   heparin   5,000 Units Subcutaneous Q8H   hydrALAZINE  50 mg Oral Q8H   isosorbide mononitrate  30 mg Oral Daily   metoprolol succinate  12.5 mg Oral Daily   potassium chloride  40 mEq Oral Q4H   sodium chloride  flush  3 mL Intravenous Q12H   Continuous Infusions:  cefTRIAXone (ROCEPHIN)  IV 1 g (02/22/24 0949)   PRN Meds:.acetaminophen  **OR** acetaminophen , polyethylene glycol   I have personally reviewed following labs and imaging studies  LABORATORY DATA: CBC: Recent Labs  Lab 02/19/24 0933 02/20/24 0428 02/21/24 0435  WBC 9.2 8.5 8.7  NEUTROABS 7.3  --   --   HGB 13.4 12.7 13.3  HCT 41.2 39.3 40.5  MCV 92.8 93.1 92.5  PLT 209 201 202    Basic Metabolic Panel: Recent Labs  Lab 02/19/24 0933 02/20/24 0428  02/21/24 0435 02/22/24 0402  NA 141 140 141 139  K 4.2 3.5 3.6 3.2*  CL 104 105 99 100  CO2 21* 26 31 29   GLUCOSE 126* 106* 90 94  BUN 39* 36* 30* 31*  CREATININE 1.68* 1.68* 1.73* 1.65*  CALCIUM 9.7 8.7* 9.0 8.9  MG  --  1.9  --  2.1    GFR: Estimated Creatinine Clearance: 27.8 mL/min (A) (by C-G formula based on SCr of 1.65 mg/dL (H)).  Liver Function Tests: Recent Labs  Lab 02/19/24 0933 02/20/24 0428 02/21/24 0435 02/22/24 0402  AST 218* 150* 171* 79*  ALT 493* 377* 403* 278*  ALKPHOS 43 25* 29* 26*  BILITOT 1.1 0.9 1.1 1.0  PROT 6.3* 5.8* 6.0* 5.6*  ALBUMIN 3.6 2.9* 3.0* 2.9*   No results for input(s): "LIPASE", "AMYLASE" in the last 168 hours. No results for input(s): "AMMONIA" in the last 168 hours.  Coagulation Profile: No results for input(s): "INR", "PROTIME" in the last 168 hours.  Cardiac Enzymes: No results for input(s): "CKTOTAL", "CKMB", "CKMBINDEX", "TROPONINI" in the last 168  hours.  BNP (last 3 results) Recent Labs    02/19/24 0933  PROBNP >35,000.0*    Lipid Profile: No results for input(s): "CHOL", "HDL", "LDLCALC", "TRIG", "CHOLHDL", "LDLDIRECT" in the last 72 hours.  Thyroid  Function Tests: No results for input(s): "TSH", "T4TOTAL", "FREET4", "T3FREE", "THYROIDAB" in the last 72 hours.  Anemia Panel: No results for input(s): "VITAMINB12", "FOLATE", "FERRITIN", "TIBC", "IRON", "RETICCTPCT" in the last 72 hours.  Urine analysis:    Component Value Date/Time   COLORURINE YELLOW 02/19/2024 0933   APPEARANCEUR HAZY (A) 02/19/2024 0933   LABSPEC 1.008 02/19/2024 0933   PHURINE 7.5 02/19/2024 0933   GLUCOSEU NEGATIVE 02/19/2024 0933   HGBUR NEGATIVE 02/19/2024 0933   BILIRUBINUR NEGATIVE 02/19/2024 0933   KETONESUR NEGATIVE 02/19/2024 0933   PROTEINUR NEGATIVE 02/19/2024 0933   NITRITE POSITIVE (A) 02/19/2024 0933   LEUKOCYTESUR LARGE (A) 02/19/2024 0933    Sepsis Labs: Lactic Acid, Venous No results found for:  "LATICACIDVEN"  MICROBIOLOGY: Recent Results (from the past 240 hours)  Resp panel by RT-PCR (RSV, Flu A&B, Covid) Anterior Nasal Swab     Status: None   Collection Time: 02/19/24  9:33 AM   Specimen: Anterior Nasal Swab  Result Value Ref Range Status   SARS Coronavirus 2 by RT PCR NEGATIVE NEGATIVE Final    Comment: (NOTE) SARS-CoV-2 target nucleic acids are NOT DETECTED.  The SARS-CoV-2 RNA is generally detectable in upper respiratory specimens during the acute phase of infection. The lowest concentration of SARS-CoV-2 viral copies this assay can detect is 138 copies/mL. A negative result does not preclude SARS-Cov-2 infection and should not be used as the sole basis for treatment or other patient management decisions. A negative result may occur with  improper specimen collection/handling, submission of specimen other than nasopharyngeal swab, presence of viral mutation(s) within the areas targeted by this assay, and inadequate number of viral copies(<138 copies/mL). A negative result must be combined with clinical observations, patient history, and epidemiological information. The expected result is Negative.  Fact Sheet for Patients:  BloggerCourse.com  Fact Sheet for Healthcare Providers:  SeriousBroker.it  This test is no t yet approved or cleared by the [place]  FDA and  has been authorized for detection and/or diagnosis of SARS-CoV-2 by FDA under an Emergency Use Authorization (EUA). This EUA will remain  in effect (meaning this test can be used) for the duration of the COVID-19 declaration under Section 564(b)(1) of the Act, 21 U.S.C.section 360bbb-3(b)(1), unless the authorization is terminated  or revoked sooner.       Influenza A by PCR NEGATIVE NEGATIVE Final   Influenza B by PCR NEGATIVE NEGATIVE Final    Comment: (NOTE) The Xpert Xpress SARS-CoV-2/FLU/RSV plus assay is intended as an aid in the  diagnosis of influenza from Nasopharyngeal swab specimens and should not be used as a sole basis for treatment. Nasal washings and aspirates are unacceptable for Xpert Xpress SARS-CoV-2/FLU/RSV testing.  Fact Sheet for Patients: BloggerCourse.com  Fact Sheet for Healthcare Providers: SeriousBroker.it  This test is not yet approved or cleared by the [place]  FDA and has been authorized for detection and/or diagnosis of SARS-CoV-2 by FDA under an Emergency Use Authorization (EUA). This EUA will remain in effect (meaning this test can be used) for the duration of the COVID-19 declaration under Section 564(b)(1) of the Act, 21 U.S.C. section 360bbb-3(b)(1), unless the authorization is terminated or revoked.     Resp Syncytial Virus by PCR NEGATIVE NEGATIVE Final    Comment: (NOTE) Fact Sheet  for Patients: BloggerCourse.com  Fact Sheet for Healthcare Providers: SeriousBroker.it  This test is not yet approved or cleared by the [place]  FDA and has been authorized for detection and/or diagnosis of SARS-CoV-2 by FDA under an Emergency Use Authorization (EUA). This EUA will remain in effect (meaning this test can be used) for the duration of the COVID-19 declaration under Section 564(b)(1) of the Act, 21 U.S.C. section 360bbb-3(b)(1), unless the authorization is terminated or revoked.  Performed at [facility], [address] [place], [place] [address]   Urine Culture (for pregnant, neutropenic or urologic patients or patients with an indwelling urinary catheter)     Status: Abnormal   Collection Time: 02/19/24  5:26 PM   Specimen: Urine, Clean Catch  Result Value Ref Range Status   Specimen Description URINE, CLEAN CATCH  Final   Special Requests NONE  Final   Culture (A)  Final    <10,000 COLONIES/mL INSIGNIFICANT GROWTH Performed at [facility], [address], [place], [place] [address]    Report Status 02/22/2024 FINAL  Final    RADIOLOGY STUDIES/RESULTS: ECHOCARDIOGRAM COMPLETE Result Date: 02/20/2024    ECHOCARDIOGRAM REPORT   Patient Name:   XAVIER FORRISTER Loken Date of Exam: 02/20/2024 Medical Rec #:  [id number]         Height:       65.0 in Accession #:    [id number]        Weight:       188.3 lb Date of Birth:  [birth year]          BSA:          1.928 m Patient Age:    83 years          BP:           185/92 mmHg Patient Gender: F                 HR:           80 bpm. Exam Location:  Inpatient Procedure: 2D Echo, Cardiac Doppler, Color Doppler and Intracardiac            Opacification Agent (Both Spectral and Color Flow Doppler were            utilized during procedure). Indications:    CHF  History:        Patient has no prior history of Echocardiogram examinations.                 CHF.  Sonographer:    Astrid Blamer Referring Phys: [id number] Gayl Katos MELVIN IMPRESSIONS  1. Left ventricular ejection fraction, by estimation, is 20 to 25%. The left ventricle has severely decreased function. The left ventricle demonstrates global hypokinesis. There is moderate concentric left ventricular hypertrophy. Left ventricular diastolic parameters are consistent with Grade III diastolic dysfunction (restrictive).  2. Right ventricular systolic function is moderately reduced. The right ventricular size is normal. There is normal pulmonary artery systolic pressure. The estimated right ventricular systolic pressure is 33.3 mmHg.  3. Left atrial size was severely dilated.  4. Right atrial size was mildly dilated.  5. Moderate pleural effusion in the left lateral region.  6. The mitral valve is degenerative. Mild to moderate mitral valve regurgitation. Mild mitral stenosis.  7. The aortic valve is tricuspid. There is mild calcification of the aortic valve. Aortic valve regurgitation is mild. Aortic valve sclerosis/calcification is present, without any  evidence of aortic stenosis.  8. The inferior vena cava  is normal in size with greater than 50% respiratory variability, suggesting right atrial pressure of 3 mmHg. Conclusion(s)/Recommendation(s): Consider evalaution for infiltrative cardiomopathy, if clinically indicated. FINDINGS  Left Ventricle: Left ventricular ejection fraction, by estimation, is 20 to 25%. The left ventricle has severely decreased function. The left ventricle demonstrates global hypokinesis. Definity contrast agent was given IV to delineate the left ventricular endocardial borders. The left ventricular internal cavity size was normal in size. There is moderate concentric left ventricular hypertrophy. Left ventricular diastolic parameters are consistent with Grade III diastolic dysfunction (restrictive). Right Ventricle: The right ventricular size is normal. No increase in right ventricular wall thickness. Right ventricular systolic function is moderately reduced. There is normal pulmonary artery systolic pressure. The tricuspid regurgitant velocity is 2.66 m/s, and with an assumed right atrial pressure of 5 mmHg, the estimated right ventricular systolic pressure is 33.3 mmHg. Left Atrium: Left atrial size was severely dilated. Right Atrium: Right atrial size was mildly dilated. Pericardium: There is no evidence of pericardial effusion. Mitral Valve: The mitral valve is degenerative in appearance. There is moderate calcification of the mitral valve leaflet(s). Mild to moderate mitral valve regurgitation. Mild mitral valve stenosis. MV peak gradient, 6.7 mmHg. The mean mitral valve gradient is 3.0 mmHg. Tricuspid Valve: The tricuspid valve is normal in structure. Tricuspid valve regurgitation is mild . No evidence of tricuspid stenosis. Aortic Valve: The aortic valve is tricuspid. There is mild calcification of the aortic valve. Aortic valve regurgitation is mild. Aortic valve sclerosis/calcification is present, without any evidence of aortic  stenosis. Aortic valve mean gradient measures 2.0 mmHg. Aortic valve peak gradient measures 4.2 mmHg. Aortic valve area, by VTI measures 2.51 cm. Pulmonic Valve: The pulmonic valve was normal in structure. Pulmonic valve regurgitation is mild. No evidence of pulmonic stenosis. Aorta: The aortic root is normal in size and structure. Venous: The inferior vena cava is normal in size with greater than 50% respiratory variability, suggesting right atrial pressure of 3 mmHg. IAS/Shunts: No atrial level shunt detected by color flow Doppler. Additional Comments: There is a moderate pleural effusion in the left lateral region.  LEFT VENTRICLE PLAX 2D LVIDd:         5.40 cm   Diastology LVIDs:         4.90 cm   LV e' medial:    2.28 cm/s LV PW:         1.20 cm   LV E/e' medial:  60.1 LV IVS:        1.00 cm   LV e' lateral:   4.90 cm/s LVOT diam:     1.90 cm   LV E/e' lateral: 28.0 LV SV:         38 LV SV Index:   20 LVOT Area:     2.84 cm  RIGHT VENTRICLE RV S prime:     9.79 cm/s TAPSE (M-mode): 1.3 cm LEFT ATRIUM              Index        RIGHT ATRIUM           Index LA Vol (A2C):   90.2 ml  46.78 ml/m  RA Area:     19.20 cm LA Vol (A4C):   129.0 ml 66.91 ml/m  RA Volume:   43.30 ml  22.46 ml/m LA Biplane Vol: 109.0 ml 56.54 ml/m  AORTIC VALVE AV Area (Vmax):    2.06 cm AV Area (Vmean):   2.24 cm AV Area (VTI):  2.51 cm AV Vmax:           102.00 cm/s AV Vmean:          64.000 cm/s AV VTI:            0.150 m AV Peak Grad:      4.2 mmHg AV Mean Grad:      2.0 mmHg LVOT Vmax:         74.10 cm/s LVOT Vmean:        50.600 cm/s LVOT VTI:          0.133 m LVOT/AV VTI ratio: 0.89  AORTA Ao Root diam: 3.40 cm MITRAL VALVE                TRICUSPID VALVE MV Area (PHT): 3.87 cm     TR Peak grad:   28.3 mmHg MV Area VTI:   1.30 cm     TR Vmax:        266.00 cm/s MV Peak grad:  6.7 mmHg MV Mean grad:  3.0 mmHg     SHUNTS MV Vmax:       1.29 m/s     Systemic VTI:  0.13 m MV Vmean:      75.8 cm/s    Systemic Diam: 1.90 cm MV  Decel Time: 196 msec MV E velocity: 137.00 cm/s MV A velocity: 77.40 cm/s MV E/A ratio:  1.77 Jules Oar MD Electronically signed by Jules Oar MD Signature Date/Time: 02/20/2024/6:47:21 PM    Final      LOS: 2 days   Kimberly Penna, MD  [facility]    To contact the attending provider between 7A-7P or the covering provider during after hours 7P-7A, please log into the web site [email or web address] and access using universal [place] password for that web site. If you do not have the password, please call the hospital operator.  02/22/2024, 11:19 AM

## 2024-02-22 NOTE — Progress Notes (Signed)
 OT Cancellation Note  Patient Details Name: Regina Bell MRN: [id number] DOB: 04/27/1941   Cancelled Treatment:    Reason Eval/Treat Not Completed: Other (comment) Pt at HD in the morning, then returned around 12:00 noon. Pt actively dry heaving and politely declining therapy today. OT will check back tomorrow for evaluation.   Ebony Goldstein [name] 02/22/2024, 12:25 PM  [place] OTR/L Acute Rehabilitation Services Office: [phone]

## 2024-02-22 NOTE — Progress Notes (Signed)
 Patient tells RN that she is missing her "gold bracelet" after being settled back from MRI. According to patient, the bracelet has her name "Vicky". Patient accounts for all the jewelry she has taken in MRI off for the procedure that was placed in a denture cup except for that bracelet she says is missing.  RN contacted staff in MRI in case it fell off in their department when patient was there. According to staff Zelma Hidden in MRI, who wrote back via secured chat message "According to the assistant-- she removed 1 beaded gold bracelet and one cross necklace. The pt commented that she had two bracelets on and Debbie told her no you only have one on right now. The pt then stated that someone else must have taken it off prior to her coming to MRI. The pt only came to MRI with one bracelet and one necklace on."  RN relay the information received from MRI staff to the patient and RN searched room for the missing bracelet. No bracelet found in bed, in linens, or in the closets. Patient's family at bedside and is aware of the conversation regarding the missing bracelet. Per patient's family member, it was possibly that patient's daughter has her bracelet taken home with her. RN called patient's daughter and left a voice message and call back number.

## 2024-02-22 NOTE — Progress Notes (Signed)
 Stress test reviewed.  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  - will stop hydralazine and Imdur - Start Entresto 24/26mg  bid.  - repeat echo in 3 months. - repeat BMP in AM - lipid panel pending.  Will likely need a statin -Otherwise, continue current regimen.  She may be ready for discharge tomorrow if renal function remains stable.   Zhoey Blackstock C. Theodis Fiscal, MD, [facility] 02/22/2024 6:10 PM

## 2024-02-23 DIAGNOSIS — J9601 Acute respiratory failure with hypoxia: Secondary | ICD-10-CM | POA: Diagnosis not present

## 2024-02-23 LAB — LIPID PANEL
Cholesterol: 336 mg/dL — ABNORMAL HIGH (ref 0–200)
HDL: 45 mg/dL (ref >40)
LDL Cholesterol: 268 mg/dL — ABNORMAL HIGH (ref 0–99)
Total CHOL/HDL Ratio: 7.5 ratio
Triglycerides: 113 mg/dL (ref <150)
VLDL: 23 mg/dL (ref 0–40)

## 2024-02-23 LAB — BASIC METABOLIC PANEL WITH GFR
Anion gap: 10 (ref 5–15)
BUN: 28 mg/dL — ABNORMAL HIGH (ref 8–23)
CO2: 29 mmol/L (ref 22–32)
Calcium: 9.2 mg/dL (ref 8.9–10.3)
Chloride: 101 mmol/L (ref 98–111)
Creatinine, Ser: 1.5 mg/dL — ABNORMAL HIGH (ref 0.44–1.00)
GFR, Estimated: 35 mL/min — ABNORMAL LOW (ref >60)
Glucose, Bld: 92 mg/dL (ref 70–99)
Potassium: 3.9 mmol/L (ref 3.5–5.1)
Sodium: 140 mmol/L (ref 135–145)

## 2024-02-23 LAB — MAGNESIUM: Magnesium: 2.1 mg/dL (ref 1.7–2.4)

## 2024-02-23 NOTE — Plan of Care (Signed)
  Problem: Education: Goal: Knowledge of General Education information will improve Description: Including pain rating scale, medication(s)/side effects and non-pharmacologic comfort measures Outcome: Progressing   Problem: Clinical Measurements: Goal: Diagnostic test results will improve Outcome: Progressing Goal: Cardiovascular complication will be avoided Outcome: Progressing   Problem: Activity: Goal: Risk for activity intolerance will decrease Outcome: Progressing   Problem: Nutrition: Goal: Adequate nutrition will be maintained Outcome: Progressing   Problem: Coping: Goal: Level of anxiety will decrease Outcome: Progressing   Problem: Pain Managment: Goal: General experience of comfort will improve and/or be controlled Outcome: Progressing

## 2024-02-23 NOTE — Plan of Care (Signed)

## 2024-02-23 NOTE — Progress Notes (Signed)
 PROGRESS NOTE        PATIENT DETAILS Name: Regina Bell Age: 83 y.o. Sex: female Date of Birth: 02-Apr-1941 Admit Date: 02/19/2024 Admitting Physician Tylene Galla Donzell Gallery, MD [id number], Albin Altes, MD  Brief Summary: Patient is a 83 y.o.  female with history of pulmonary embolism-presented with shortness of breath-upon further evaluation-she was found to have new onset HFrEF with AKI and transaminitis.  Significant events: 5/13>> admit to [facility] 5/15>> SVT-aborted by adenosine  12 mg.  Significant studies: 5/13>> CTA chest: No large PE-Limited study of lower lobe branches, 4.2 cm ascending aortic aneurysm.  Moderate B/L pleural effusions, enlarged main pulmonary artery.  Moderate cardiomegaly.  Coronary artery calcifications. 5/14>> renal ultrasound: No hydronephrosis 5/14>> RUQ ultrasound: No cholecystitis-no shadowing stones-+ sludge. 5/14>> B/L lower extremity Doppler: No DVT 5/14>> echo: EF 20-25%, RV systolic function moderately reduced.  Grade 3 diastolic dysfunction.  Significant microbiology data: 5/13>> COVID/influenza/RSV PCR: Negative 5/13>> urine culture:<10,000 colonies/mL  Procedures:  5/14 >> attempted thoracocentesis-dry tap.  5/16 Cardiac MRI - 1. 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 There is subendocardial LGE in basal inferior wall consistent with prior infarct. LGE is greater than 50% transmural suggesting nonviability. There is also basal septal midwall LGE which is a scar pattern seen in nonischemic cardiomyopathies and associated with worse prognosis. 2. Stress perfusion defect in basal inferior wall, corresponding to area of LGE on delayed enhancement imaging, consistent with prior infarct 3. RV insertion site LGE, which is a nonspecific scar pattern often seen in setting of elevated pulmonary pressures 4. Poor quality cine images, likely due to frequent ectopy, which can effect quantification  of LV/RV volumes and EF 5.  Moderate LV dilatation with severe systolic dysfunction (EF 21%) 6. Moderate asymmetric LV hypertrophy measuring 15mm in basal septum (10mm in posterior wall). While wall thickness of 15mm meets criteria for hypertrophic cardiomyopathy, could also be due to poorly controlled hypertension 7.  Mild RV dilatation with moderate systolic dysfunction (EF 31%) 8.  Dilated ascending aorta measuring 41mm 9.  Dilated main pulmonary artery measuring 32 mm.   Consults: Cardiology  Subjective:  Patient in bed, appears comfortable, denies any headache, no fever, no chest pain or pressure, no shortness of breath , no abdominal pain. No new focal weakness.   Objective: Vitals: Blood pressure 126/63, pulse 69, temperature 97.7 F (36.5 C), resp. rate 15, height 5\' 5"  (1.651 m), weight 81.7 kg, SpO2 96%.   Exam:  Awake Alert, No new F.N deficits, Normal affect [place].AT,PERRAL Supple Neck, No JVD,   Symmetrical Chest wall movement, Good air movement bilaterally, CTAB RRR,No Gallops, Rubs or new Murmurs,  +ve B.Sounds, Abd Soft, No tenderness,   No Cyanosis, Clubbing or edema    Assessment/Plan:  Acute hypoxic respiratory failure Secondary to new onset HFrEF Appears comfortable-either on room air or on just 2 L of oxygen Continue diuretics and wean oxygen as able.  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  Preliminary results suggestive of late gadolinium enhancement, likely scar versus cardiomyopathy.  AKI Likely hemodynamically mediated-potentially cardiorenal syndrome UA negative for proteinuria Renal ultrasound without hydronephrosis Creatinine is essentially unchanged and stable for the past several days. Continue supportive  care Avoid nephrotoxic agents  Hypokalemia Replete/recheck.  Transaminitis Suspect secondary to hepatic congestion from biventricular heart failure. Acute hepatitis serology-negative RUQ ultrasound-without any significant abnormalities Continue to trend LFTs periodically-thankfully slowly downtrending with elevation of GDMT meds/diuretics.  B/L pleural effusion Dry tap on attempted thoracocentesis on 5/14 Etiology likely due to CHF-transudative physiology.  History of unprovoked PE 2018 No central PE seen on CTA B/L lower extremity Dopplers negative for DVT Per my discussion with patient on 5/14-looks like she completed 6 months of Xarelto  and then was taken off anticoagulation.  UTI Completed 3 days of Rocephin  Urine culture nondiagnostic.  4.2 cm ascending aortic aneurysm Incidental finding on CTA chest Radiology recommending annual follow-up by CTA or MRA.  Class I obesity: Estimated body mass index is 29.97 kg/m as calculated from the following:   Height as of this encounter: 5\' 5"  (1.651 m).   Weight as of this encounter: 81.7 kg.   Code status:   Code Status: Full Code   DVT Prophylaxis: heparin  injection 5,000 Units Start: 02/19/24 2200   Family Communication: Son [phone] updated over the phone on 5/16   Disposition Plan: Status is: Observation The patient will require care spanning > 2 midnights and should be moved to inpatient because: Severity of illness   Planned Discharge Destination:Home   Diet: Diet Order             Diet Heart Room service appropriate? Yes; Fluid consistency: Thin; Fluid restriction: 1800 mL Fluid  Diet effective now                     Antimicrobial agents: Anti-infectives (From admission, onward)    Start     Dose/Rate Route Frequency Ordered Stop   02/20/24 1000  cefTRIAXone  (ROCEPHIN ) 1 g in sodium chloride  0.9 % 100 mL IVPB  Status:  Discontinued        1 g 200 mL/hr over 30 Minutes Intravenous  Every 24 hours 02/19/24 1726 02/22/24 1122   02/19/24 1100  cefTRIAXone  (ROCEPHIN ) 1 g in sodium chloride  0.9 % 100 mL IVPB        1 g 200 mL/hr over 30 Minutes Intravenous  Once 02/19/24 1045 02/19/24 1200        MEDICATIONS: Scheduled Meds:  dapagliflozin  propanediol  10 mg Oral Daily   furosemide   40 mg Oral Daily   heparin   5,000 Units Subcutaneous Q8H   metoprolol  succinate  12.5 mg Oral Daily   sacubitril -valsartan   1 tablet Oral BID   sodium chloride  flush  3 mL Intravenous Q12H   Continuous Infusions:   PRN Meds:.acetaminophen  **OR** acetaminophen , polyethylene glycol   I have personally reviewed following labs and imaging studies  LABORATORY DATA: CBC: Recent Labs  Lab 02/19/24 0933 02/20/24 0428 02/21/24 0435  WBC 9.2 8.5 8.7  NEUTROABS 7.3  --   --   HGB 13.4 12.7 13.3  HCT 41.2 39.3 40.5  MCV 92.8 93.1 92.5  PLT 209 201 202    Basic Metabolic Panel: Recent Labs  Lab 02/19/24 0933 02/20/24 0428 02/21/24 0435 02/22/24 0402 02/23/24 0354  NA 141 140 141 139 140  K 4.2 3.5 3.6 3.2* 3.9  CL 104 105 99 100 101  CO2 21* 26 31 29 29   GLUCOSE 126* 106* 90 94 92  BUN 39* 36* 30* 31* 28*  CREATININE 1.68* 1.68* 1.73* 1.65* 1.50*  CALCIUM 9.7 8.7* 9.0 8.9 9.2  MG  --  1.9  --  2.1 2.1    GFR: Estimated Creatinine Clearance:  30.5 mL/min (A) (by C-G formula based on SCr of 1.5 mg/dL (H)).  Liver Function Tests: Recent Labs  Lab 02/19/24 0933 02/20/24 0428 02/21/24 0435 02/22/24 0402  AST 218* 150* 171* 79*  ALT 493* 377* 403* 278*  ALKPHOS 43 25* 29* 26*  BILITOT 1.1 0.9 1.1 1.0  PROT 6.3* 5.8* 6.0* 5.6*  ALBUMIN 3.6 2.9* 3.0* 2.9*   No results for input(s): "LIPASE", "AMYLASE" in the last 168 hours. No results for input(s): "AMMONIA" in the last 168 hours.  Coagulation Profile: No results for input(s): "INR", "PROTIME" in the last 168 hours.  Cardiac Enzymes: No results for input(s): "CKTOTAL", "CKMB", "CKMBINDEX", "TROPONINI" in the  last 168 hours.  BNP (last 3 results) Recent Labs    02/19/24 0933  PROBNP >35,000.0*    Lipid Profile: Recent Labs    02/23/24 0354  CHOL 336*  HDL 45  LDLCALC 268*  TRIG 113  CHOLHDL 7.5    Thyroid  Function Tests: No results for input(s): "TSH", "T4TOTAL", "FREET4", "T3FREE", "THYROIDAB" in the last 72 hours.  Anemia Panel: No results for input(s): "VITAMINB12", "FOLATE", "FERRITIN", "TIBC", "IRON", "RETICCTPCT" in the last 72 hours.  Urine analysis:    Component Value Date/Time   COLORURINE YELLOW 02/19/2024 0933   APPEARANCEUR HAZY (A) 02/19/2024 0933   LABSPEC 1.008 02/19/2024 0933   PHURINE 7.5 02/19/2024 0933   GLUCOSEU NEGATIVE 02/19/2024 0933   HGBUR NEGATIVE 02/19/2024 0933   BILIRUBINUR NEGATIVE 02/19/2024 0933   KETONESUR NEGATIVE 02/19/2024 0933   PROTEINUR NEGATIVE 02/19/2024 0933   NITRITE POSITIVE (A) 02/19/2024 0933   LEUKOCYTESUR LARGE (A) 02/19/2024 0933    Sepsis Labs: Lactic Acid, Venous No results found for: "LATICACIDVEN"  MICROBIOLOGY: Recent Results (from the past 240 hours)  Resp panel by RT-PCR (RSV, Flu A&B, Covid) Anterior Nasal Swab     Status: None   Collection Time: 02/19/24  9:33 AM   Specimen: Anterior Nasal Swab  Result Value Ref Range Status   SARS Coronavirus 2 by RT PCR NEGATIVE NEGATIVE Final    Comment: (NOTE) SARS-CoV-2 target nucleic acids are NOT DETECTED.  The SARS-CoV-2 RNA is generally detectable in upper respiratory specimens during the acute phase of infection. The lowest concentration of SARS-CoV-2 viral copies this assay can detect is 138 copies/mL. A negative result does not preclude SARS-Cov-2 infection and should not be used as the sole basis for treatment or other patient management decisions. A negative result may occur with  improper specimen collection/handling, submission of specimen other than nasopharyngeal swab, presence of viral mutation(s) within the areas targeted by this assay, and  inadequate number of viral copies(<138 copies/mL). A negative result must be combined with clinical observations, patient history, and epidemiological information. The expected result is Negative.  Fact Sheet for Patients:  BloggerCourse.com  Fact Sheet for Healthcare Providers:  SeriousBroker.it  This test is no t yet approved or cleared by the [place]  FDA and  has been authorized for detection and/or diagnosis of SARS-CoV-2 by FDA under an Emergency Use Authorization (EUA). This EUA will remain  in effect (meaning this test can be used) for the duration of the COVID-19 declaration under Section 564(b)(1) of the Act, 21 U.S.C.section 360bbb-3(b)(1), unless the authorization is terminated  or revoked sooner.       Influenza A by PCR NEGATIVE NEGATIVE Final   Influenza B by PCR NEGATIVE NEGATIVE Final    Comment: (NOTE) The Xpert Xpress SARS-CoV-2/FLU/RSV plus assay is intended as an aid in the diagnosis of influenza  from Nasopharyngeal swab specimens and should not be used as a sole basis for treatment. Nasal washings and aspirates are unacceptable for Xpert Xpress SARS-CoV-2/FLU/RSV testing.  Fact Sheet for Patients: BloggerCourse.com  Fact Sheet for Healthcare Providers: SeriousBroker.it  This test is not yet approved or cleared by the [place]  FDA and has been authorized for detection and/or diagnosis of SARS-CoV-2 by FDA under an Emergency Use Authorization (EUA). This EUA will remain in effect (meaning this test can be used) for the duration of the COVID-19 declaration under Section 564(b)(1) of the Act, 21 U.S.C. section 360bbb-3(b)(1), unless the authorization is terminated or revoked.     Resp Syncytial Virus by PCR NEGATIVE NEGATIVE Final    Comment: (NOTE) Fact Sheet for Patients: BloggerCourse.com  Fact Sheet for Healthcare  Providers: SeriousBroker.it  This test is not yet approved or cleared by the [place]  FDA and has been authorized for detection and/or diagnosis of SARS-CoV-2 by FDA under an Emergency Use Authorization (EUA). This EUA will remain in effect (meaning this test can be used) for the duration of the COVID-19 declaration under Section 564(b)(1) of the Act, 21 U.S.C. section 360bbb-3(b)(1), unless the authorization is terminated or revoked.  Performed at [facility], [address] [place], [place] [address]   Urine Culture (for pregnant, neutropenic or urologic patients or patients with an indwelling urinary catheter)     Status: Abnormal   Collection Time: 02/19/24  5:26 PM   Specimen: Urine, Clean Catch  Result Value Ref Range Status   Specimen Description URINE, CLEAN CATCH  Final   Special Requests NONE  Final   Culture (A)  Final    <10,000 COLONIES/mL INSIGNIFICANT GROWTH Performed at [facility], [address], [place], [place] [address]    Report Status 02/22/2024 FINAL  Final    RADIOLOGY STUDIES/RESULTS: MR CARDIAC STRESS TEST Result Date: 02/22/2024 CLINICAL DATA:  5F p/w acute heart failure. Echo with EF 20-25%, moderate LVH, G3DD, moderate RV dysfunction, mild to moderate MR EXAM: CARDIAC MRI PROCEDURE: The patient was scanned on a 1.5 Tesla Siemens magnet. A dedicated cardiac coil was used. Functional imaging was done using Fiesta sequences. 2,3, and 4 chamber views were done to assess for RWMA's. Modified Simpson's rule using a short axis stack was used to calculate an ejection fraction on a dedicated work [employer]. Rest and stress (following administration of regadenoson  0.4mg ) first-pass contrast-enhanced imaging was done. The patient received 10 cc of Gadavist . After 10 minutes inversion recovery sequences were used to assess for infiltration and scar tissue. Phase contrast velocity mapping was  performed CONTRAST:  10 cc  of Gadavist  FINDINGS: Left ventricle: -Moderate asymmetric hypertrophy measuring 15mm in basal septum (10mm in posterior wall) -Moderate dilatation -Severe systolic dysfunction -Mild ECV elevation (31%) -Basal inferior perfusion defect on stress perfusion imaging -Subendocardial LGE in basal inferior wall consistent with prior infarct. LGE is greater than 50% transmural suggesting nonviability -Basal septal midwall LGE -RV insertion site LGE LV EF:  21% (Normal 52-79%) Absolute volumes: LV EDV: (Normal 78-167 mL) LV ESV: (Normal 21-64 mL) LV SV: 62mL (Normal 52-114 mL) CO: 4.6L/min (Normal 2.7-6.3 L/min) Indexed volumes: LV EDV: 132mL/sq-m (Normal 50-96 mL/sq-m) LV ESV: 117mL/sq-m (Normal 10-40 mL/sq-m) LV SV: 78mL/sq-m (Normal 33-64 mL/sq-m) CI: 2.4L/min/sq-m (Normal 1.9-3.9 L/min/sq-m) Right ventricle: Mild dilatation with moderate systolic dysfunction RV EF: 31% (Normal 52-80%) Absolute volumes: RV EDV: (Normal 79-175 mL) RV ESV: (Normal 13-75 mL) RV SV: 61mL (  Normal 56-110 mL) CO: 4.5L/min (Normal 2.7-6 L/min) Indexed volumes: RV EDV: 152mL/sq-m (Normal 51-97 mL/sq-m) RV ESV: 36mL/sq-m (Normal 9-42 mL/sq-m) RV SV: 35mL/sq-m (Normal 35-61 mL/sq-m) CI: 2.3L/min/sq-m (Normal 1.8-3.8 L/min/sq-m) Left atrium: Moderate enlargement Right atrium: Normal size Mitral valve: Not quantified due to artifact on phase contrast velocity mapping. Visually appears mild Aortic valve: Visually mild regurgitation Tricuspid valve: Visually mild regurgitation Pulmonic valve: Visually trivial regurgitation Aorta: Dilated ascending aorta measuring 41mm Pulmonary artery: Dilated main PA measuring 32mm Pericardium: Normal IMPRESSION: 1. 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 There is subendocardial LGE in basal inferior wall consistent with prior infarct. LGE is greater than 50% transmural suggesting nonviability. There is also basal septal  midwall LGE which is a scar pattern seen in nonischemic cardiomyopathies and associated with worse prognosis. 2. Stress perfusion defect in basal inferior wall, corresponding to area of LGE on delayed enhancement imaging, consistent with prior infarct 3. RV insertion site LGE, which is a nonspecific scar pattern often seen in setting of elevated pulmonary pressures 4. Poor quality cine images, likely due to frequent ectopy, which can effect quantification of LV/RV volumes and EF 5.  Moderate LV dilatation with severe systolic dysfunction (EF 21%) 6. Moderate asymmetric LV hypertrophy measuring 15mm in basal septum (10mm in posterior wall). While wall thickness of 15mm meets criteria for hypertrophic cardiomyopathy, could also be due to poorly controlled hypertension 7.  Mild RV dilatation with moderate systolic dysfunction (EF 31%) 8.  Dilated ascending aorta measuring 41mm 9.  Dilated main pulmonary artery measuring 32mm Electronically Signed   By: Carson Clara M.D.   On: 02/22/2024 16:04     LOS: 3 days   Signature  -    Lynnwood Sauer M.D on 02/23/2024 at 11:33 AM   -  To page go to [email or web address]

## 2024-02-23 NOTE — Progress Notes (Signed)
 Physical Therapy Treatment Patient Details Name: Regina Bell MRN: [id number] DOB: Mar 04, 1941 Today's Date: 02/23/2024   History of Present Illness 83 y.o.  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 PMH of pulmonary embolism.    PT Comments  Moving better today. Ambulating mod I when PT entered room. Ambulates at supervision level around unit with mild instability exacerbated by dynamic challenges. Able to self correct. DGI indicates increased fall risk at 17/24. Educated on safety with mobility, awareness, need for [facility] use when walking, and recommended OPPT follow-up. Pt agreeable.  She has no further questions at this time. RN notified difficulty obtaining pleth and can check later while walking with staff. Pt asymptomatic without dyspnea while working with therapist. Patient will continue to benefit from skilled physical therapy services to further improve independence with functional mobility.    If plan is discharge home, recommend the following: Assistance with cooking/housework;Assist for transportation;Help with stairs or ramp for entrance   Can travel by private vehicle        Equipment Recommendations  None recommended by PT    Recommendations for Other Services       Precautions / Restrictions Precautions Precautions: Fall Recall of Precautions/Restrictions: Intact Restrictions Weight Bearing Restrictions Per Provider Order: No     Mobility  Bed Mobility               General bed mobility comments: Standing at sink when PT entered room    Transfers Overall transfer level: Needs assistance Equipment used: None Transfers: Sit to/from Stand Sit to Stand: Supervision           General transfer comment: Standing at sink. Able to sit with good control in recliner.    Ambulation/Gait Ambulation/Gait assistance: Supervision Gait Distance (Feet): 175 Feet Assistive device: None Gait Pattern/deviations: Step-through  pattern, Decreased stride length, Scissoring Gait velocity: dec Gait velocity interpretation: 1.31 - 2.62 ft/sec, indicative of limited community ambulator   General Gait Details: Improved pace today, one episode of scissoring when challenged with dyanmic tasks but able to self correct, minor drift from straight path at times. Recommend she us  SPC, Verbalized understanding. Supervision for safety throughout session. Was walk mod I when PT entered room   Stairs Stairs: Yes Stairs assistance: Supervision Stair Management: One rail Right, Step to pattern, Forwards Number of Stairs: 1 (x8) General stair comments: Practiced navigating single step in room multiple times, up and down using single UE support similar to described home set-up. Performed at a supervision level without physical assistance. Feels confident with task, declines further practice. Step is actually smaller than the one utilized today, and she should be adequately strong to navigate once home.   Wheelchair Mobility     Tilt Bed    Modified Rankin (Stroke Patients Only)       Balance Overall balance assessment: Needs assistance Sitting-balance support: No upper extremity supported, Feet supported Sitting balance-Leahy Scale: Normal     Standing balance support: No upper extremity supported Standing balance-Leahy Scale: Fair                   Standardized Balance Assessment Standardized Balance Assessment : Dynamic Gait Index   Dynamic Gait Index Level Surface: Normal Change in Gait Speed: Normal Gait with Horizontal Head Turns: Mild Impairment Gait with Vertical Head Turns: Mild Impairment Gait and Pivot Turn: Mild Impairment Step Over Obstacle: Moderate Impairment Step Around Obstacles: Mild Impairment Steps: Mild Impairment Total Score: 17  Communication Communication Communication: No apparent difficulties  Cognition Arousal: Alert Behavior During Therapy: WFL for tasks  assessed/performed   PT - Cognitive impairments: No apparent impairments                         Following commands: Intact      Cueing Cueing Techniques: Verbal cues  Exercises      General Comments General comments (skin integrity, edema, etc.): could not obtain pleth; RN notified, will try to check later while ambulating. No dyspnea with activity.      Pertinent Vitals/Pain Pain Assessment Pain Assessment: No/denies pain    Home Living                          Prior Function            PT Goals (current goals can now be found in the care plan section) Acute Rehab PT Goals Patient Stated Goal: Go home. Agreeable to OPPT PT Goal Formulation: With patient Time For Goal Achievement: 03/06/24 Potential to Achieve Goals: Good Progress towards PT goals: Progressing toward goals    Frequency    Min 2X/week      PT Plan      Co-evaluation              AM-PAC PT "6 Clicks" Mobility   Outcome Measure  Help needed turning from your back to your side while in a flat bed without using bedrails?: None Help needed moving from lying on your back to sitting on the side of a flat bed without using bedrails?: None Help needed moving to and from a bed to a chair (including a wheelchair)?: A Little Help needed standing up from a chair using your arms (e.g., wheelchair or bedside chair)?: A Little Help needed to walk in hospital room?: A Little Help needed climbing 3-5 steps with a railing? : A Little 6 Click Score: 20    End of Session Equipment Utilized During Treatment: Gait belt Activity Tolerance: Patient tolerated treatment well Patient left: in chair;with call bell/phone within reach;with family/visitor present Nurse Communication: Mobility status (sats) PT Visit Diagnosis: Unsteadiness on feet (R26.81);Other abnormalities of gait and mobility (R26.89);Muscle weakness (generalized) (M62.81)     Time: 1422-1430 PT Time Calculation (min)  (ACUTE ONLY): 8 min  Charges:    $Gait Training: 8-22 mins PT General Charges $$ ACUTE PT VISIT: 1 Visit                     Regina Bell, PT, DPT [facility]  Rehabilitation Services Physical Therapist Office: [phone] Website: [email or web address]    Regina Bell 02/23/2024, 2:44 PM

## 2024-02-23 NOTE — Evaluation (Signed)
 Occupational Therapy Evaluation Patient Details Name: Regina Bell MRN: [id number] DOB: 06/25/1941 Today's Date: 02/23/2024   History of Present Illness   83 y.o.  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 PMH of pulmonary embolism.     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  Patient reports that she uses a cane at times for functional mobility. Patient currently requires CGA for functional mobility 2/2 slight unsteadiness and touch points needed on furniture to stabilize self, patient trialed  with cane and was more steady.  Patient requires CGA for toileting transfer.  Patient would benefit from additional OT intervention while here to address strength, safety, and fall prevention     If plan is discharge home, recommend the following:   Assistance with cooking/housework     Functional Status Assessment   Patient has had a recent decline in their functional status and demonstrates the ability to make significant improvements in function in a reasonable and predictable amount of time.     Equipment Recommendations   None recommended by OT     Recommendations for Other Services         Precautions/Restrictions   Precautions Precautions: Fall Recall of Precautions/Restrictions: Intact Restrictions Weight Bearing Restrictions Per Provider Order: No     Mobility Bed Mobility Overal bed mobility:  (patient in bedside chair upon arrival into room)                  Transfers Overall transfer level: Needs assistance Equipment used: None Transfers: Sit to/from Stand Sit to Stand: Supervision                  Balance Overall balance assessment: Needs assistance Sitting-balance support: No upper extremity supported, Feet supported Sitting balance-Leahy Scale: Normal                                      ADL either performed or assessed with clinical judgement   ADL Overall ADL's : Needs assistance/impaired Eating/Feeding: Independent   Grooming: Wash/dry hands;Wash/dry face;Supervision/safety   Upper Body Bathing: Set up;Sitting   Lower Body Bathing: Contact guard assist   Upper Body Dressing : Set up;Sitting   Lower Body Dressing: Contact guard assist   Toilet Transfer: Contact guard assist   Toileting- Clothing Manipulation and Hygiene: Contact guard assist       Functional mobility during ADLs: Contact guard assist (patient furtniture walking from bedside chair to bathroom, wit touch points on end of bed, sink and door  PAtient trialed with her cane and was more steady)       Vision Baseline Vision/History: 1 Wears glasses Patient Visual Report: No change from baseline       Perception         Praxis         Pertinent Vitals/Pain Pain Assessment Pain Assessment: 0-10 Pain Score: 0-No pain Pain Intervention(s): Monitored during session     Extremity/Trunk Assessment Upper Extremity Assessment Upper Extremity Assessment: Overall WFL for tasks assessed   Lower Extremity Assessment Lower Extremity Assessment: Defer to PT evaluation   Cervical / Trunk Assessment Cervical / Trunk Assessment: Normal   Communication Communication Communication: No apparent difficulties   Cognition Arousal: Alert Behavior During Therapy: WFL for tasks assessed/performed Cognition:  No apparent impairments                               Following commands: Intact       Cueing  General Comments   Cueing Techniques: Verbal cues  could not obtain pleth; RN notified, will try to check later while ambulating. No dyspnea with activity.   Exercises     Shoulder Instructions      Home Living Family/patient expects to be discharged to:: Private residence Living Arrangements: Alone Available Help at Discharge:  Family;Available PRN/intermittently Type of Home: House Home Access: Stairs to enter [employer] of Steps: 2 Entrance Stairs-Rails: None Home Layout: Two level;Able to live on main level with bedroom/bathroom     Bathroom Shower/Tub: Producer, television/film/video: Standard Bathroom Accessibility: No   Home Equipment: Cane - single point;Shower seat;Grab bars - tub/shower          Prior Functioning/Environment Prior Level of Function : Independent/Modified Independent                    OT Problem List: Decreased activity tolerance;Decreased safety awareness   OT Treatment/Interventions: Self-care/ADL training;Therapeutic exercise;Therapeutic activities;Patient/family education      OT Goals(Current goals can be found in the care plan section)   Acute Rehab OT Goals Patient Stated Goal: to go home OT Goal Formulation: With patient Time For Goal Achievement: 03/08/24 Potential to Achieve Goals: Good   OT Frequency:  Min 1X/week    Co-evaluation              AM-PAC OT "6 Clicks" Daily Activity     Outcome Measure Help from another person eating meals?: None Help from another person taking care of personal grooming?: None Help from another person toileting, which includes using toliet, bedpan, or urinal?: A Little Help from another person bathing (including washing, rinsing, drying)?: A Little Help from another person to put on and taking off regular upper body clothing?: None Help from another person to put on and taking off regular lower body clothing?: A Little 6 Click Score: 21   End of Session Nurse Communication: Mobility status  Activity Tolerance: Patient tolerated treatment well Patient left: in chair;with call bell/phone within reach  OT Visit Diagnosis: Unsteadiness on feet (R26.81);Muscle weakness (generalized) (M62.81)                Time: 1457-1510 OT Time Calculation (min): 13 min Charges:  OT General Charges $OT  Visit: 1 Visit OT Evaluation $OT Eval Moderate Complexity: 1 Mod  Lovett Ruck OT/L  Lacretia Piccolo 02/23/2024, 4:25 PM

## 2024-02-23 NOTE — Progress Notes (Signed)
 PT Cancellation Note  Patient Details Name: Regina Bell MRN: [id number] DOB: 04-Feb-1941   Cancelled Treatment:    Reason Eval/Treat Not Completed: (P) Other (comment) (Pt requesting to order lunch and requested PT to return, will f/u per POC.)   Dionna Wiedemann J [name] 02/23/2024, 1:26 PM  Benecio Kluger R. , PTA Acute Rehabilitation Services Office [phone]

## 2024-02-24 DIAGNOSIS — J9601 Acute respiratory failure with hypoxia: Secondary | ICD-10-CM | POA: Diagnosis not present

## 2024-02-24 MED ORDER — METOPROLOL SUCCINATE ER 25 MG PO TB24
12.5000 mg | ORAL_TABLET | Freq: Every day | ORAL | 0 refills | Status: DC
Start: 2024-02-24 — End: 2024-06-09

## 2024-02-24 MED ORDER — FUROSEMIDE 40 MG PO TABS: 40.0000 mg | ORAL_TABLET | Freq: Every day | ORAL | 0 refills | Status: DC

## 2024-02-24 MED ORDER — ROSUVASTATIN CALCIUM 5 MG PO TABS
10.0000 mg | ORAL_TABLET | Freq: Every day | ORAL | Status: DC
Start: 2024-02-24 — End: 2024-02-24
  Administered 2024-02-24: 10 mg via ORAL
  Filled 2024-02-24: qty 2

## 2024-02-24 MED ORDER — LOSARTAN POTASSIUM 25 MG PO TABS: 25.0000 mg | ORAL_TABLET | Freq: Every day | ORAL | 0 refills | Status: DC

## 2024-02-24 MED ORDER — ROSUVASTATIN CALCIUM 10 MG PO TABS: 10.0000 mg | ORAL_TABLET | Freq: Every day | ORAL | 0 refills | Status: DC

## 2024-02-24 NOTE — Discharge Summary (Signed)
 Regina Bell ZOX:[id number] DOB: 1940-11-12 DOA: 02/19/2024  PCP: Ozell Blunt, MD  Admit date: 02/19/2024  Discharge date: 02/24/2024  Admitted From: Home   Disposition:  Home   Recommendations for Outpatient Follow-up:   Follow up with PCP in 1-2 weeks  PCP Please obtain BMP/CBC, 2 view CXR in 1week,  (see Discharge instructions)   PCP Please follow up on the following pending results: Needs close outpatient cardiology follow-up, check two-view chest x-ray, CBC, BMP, blood pressure in 5 to 7 days, needs repeat CT chest in 9 to 12 months, monitor 4.2 cm ascending aortic aneurysm closely.   Home Health: None   Equipment/Devices: None  Consultations: Cards Discharge Condition: Stable    CODE STATUS: Full    Diet Recommendation: Heart Healthy with 1.5 L fluid restriction per day    Chief Complaint  Patient presents with   Shortness of Breath     Brief history of present illness from the day of admission and additional interim summary     83 y.o.  female with history of pulmonary embolism-presented with shortness of breath-upon further evaluation-she was found to have new onset HFrEF with AKI and transaminitis.   Significant events: 5/13>> admit to [facility] 5/15>> SVT-aborted by adenosine  12 mg.   Significant studies: 5/13>> CTA chest: No large PE-Limited study of lower lobe branches, 4.2 cm ascending aortic aneurysm.  Moderate B/L pleural effusions, enlarged main pulmonary artery.  Moderate cardiomegaly.  Coronary artery calcifications. 5/14>> renal ultrasound: No hydronephrosis 5/14>> RUQ ultrasound: No cholecystitis-no shadowing stones-+ sludge. 5/14>> B/L lower extremity Doppler: No DVT 5/14>> echo: EF 20-25%, RV systolic function moderately reduced.  Grade 3 diastolic dysfunction.  Procedures:    5/14 >> attempted thoracocentesis-dry tap.   5/16 Cardiac MRI - 1. 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 There is subendocardial LGE in basal inferior wall consistent with prior infarct. LGE is greater than 50% transmural suggesting nonviability. There is also basal septal midwall LGE which is a scar pattern seen in nonischemic cardiomyopathies and associated with worse prognosis. 2. Stress perfusion defect in basal inferior wall, corresponding to area of LGE on delayed enhancement imaging, consistent with prior infarct 3. RV insertion site LGE, which is a nonspecific scar pattern often seen in setting of elevated pulmonary pressures 4. Poor quality cine images, likely due to frequent ectopy, which can effect quantification of LV/RV volumes and EF 5.  Moderate LV dilatation with severe systolic dysfunction (EF 21%) 6. Moderate asymmetric LV hypertrophy measuring 15mm in basal septum (10mm in posterior wall). While wall thickness of 15mm meets criteria for hypertrophic cardiomyopathy, could also be due to poorly controlled hypertension 7.  Mild RV dilatation with moderate systolic dysfunction (EF 31%) 8.  Dilated ascending aorta measuring 41mm 9.  Dilated main pulmonary artery measuring 32 mm.  Hospital Course    Acute hypoxic respiratory failure Secondary to new onset HFrEF Has completely resolved after diuresis now symptom-free and on room air.   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 Shaune Delaine in detail on 02/24/2024, patient will be discharged on below mentioned medications, she has a follow-up with the CHF clinic where Entresto  and Farxiga  will be discussed based on her insurance and her ability to afford the medications.  For now  she will be placed on a combination of beta-blocker, ARB, Lasix .  Low-dose statin also added.  PCP to monitor blood pressure, BMP and arrange for close outpatient cardiology follow-up.  Cardiac MRI done, defer result interpretation and management to cardiology who is following.  Preliminary results suggestive of late gadolinium enhancement, likely scar versus cardiomyopathy.    AKI Likely hemodynamically mediated-potentially cardiorenal syndrome UA negative for proteinuria Renal ultrasound without hydronephrosis Creatinine has stabilized, PCP to monitor closely.   Hypokalemia Replete/recheck.   Transaminitis Suspect secondary to hepatic congestion from biventricular heart failure. Acute hepatitis serology-negative RUQ ultrasound-without any significant abnormalities Continue to trend LFTs periodically-thankfully slowly downtrending with elevation of GDMT meds/diuretics.   B/L pleural effusion Dry tap on attempted thoracocentesis on 5/14 Etiology likely due to CHF-transudative physiology.  PCP to monitor.   History of unprovoked PE 2018 No central PE seen on CTA B/L lower extremity Dopplers negative for DVT Has been off of Xarelto  now for multiple years.   UTI Completed 3 days of Rocephin  Urine culture nondiagnostic.   4.2 cm ascending aortic aneurysm Incidental finding on CTA chest Radiology recommending annual follow-up by CTA or MRA.   Class I obesity: Estimated body mass index is 29.97 kg/m  follow with PCP  Discharge diagnosis     Principal Problem:   Acute respiratory failure with hypoxia (HCC) Active Problems:   UTI (urinary tract infection)   Pleural effusion due to CHF (congestive heart failure) (HCC)   Acute CHF (HCC)   CHF exacerbation (HCC)   Elevated serum creatinine   Elevated troponin   HFrEF (heart failure with reduced ejection fraction) (HCC)   SVT (supraventricular tachycardia) [facility])    Discharge instructions    Discharge Instructions      Discharge instructions   Complete by: As directed    Follow with Primary MD Ozell Blunt, MD in 7 days   Get CBC, CMP, 2 view Chest X ray -  checked next visit with your primary MD    Activity: As tolerated with Full fall precautions use walker/cane & assistance as needed  Disposition Home     Diet: Heart Healthy -  Check your Weight same time everyday, if you gain over 2 pounds, or you develop in leg swelling, experience more shortness of breath or chest pain, call your Primary MD immediately. Follow Cardiac Low Salt Diet and 1.5 lit/day fluid restriction.  Special Instructions: If you have smoked or chewed Tobacco  in the last 2 yrs please stop smoking, stop any regular Alcohol  and or any Recreational drug use.  On your next visit with your primary care physician please Get Medicines reviewed and adjusted.  Please request your Prim.MD to go over all Hospital Tests and Procedure/Radiological results at the follow up, please get all Hospital records sent to your Prim MD by signing hospital release before you go home.  If you experience worsening of your admission symptoms, develop  shortness of breath, life threatening emergency, suicidal or homicidal thoughts you must seek medical attention immediately by calling 911 or calling your MD immediately  if symptoms less severe.  You Must read complete instructions/literature along with all the possible adverse reactions/side effects for all the Medicines you take and that have been prescribed to you. Take any new Medicines after you have completely understood and accpet all the possible adverse reactions/side effects.   Do not drive when taking Pain medications.  Do not take more than prescribed Pain, Sleep and Anxiety Medications  Wear Seat belts while driving.   Increase activity slowly   Complete by: As directed        Discharge Medications   Allergies as of 02/24/2024   No Known Allergies      Medication List     STOP  taking these medications    Rivaroxaban  15 MG Tabs tablet Commonly known as: XARELTO        TAKE these medications    benzonatate  100 MG capsule Commonly known as: TESSALON  Take 1 capsule (100 mg total) by mouth every 8 (eight) hours.   furosemide  40 MG tablet Commonly known as: LASIX  Take 1 tablet (40 mg total) by mouth daily.   losartan 25 MG tablet Commonly known as: Cozaar Take 1 tablet (25 mg total) by mouth daily.   metoprolol  succinate 25 MG 24 hr tablet Commonly known as: TOPROL -XL Take 0.5 tablets (12.5 mg total) by mouth daily.   OVER THE COUNTER MEDICATION Take 2 tablets by mouth every morning. *otc called blood flow*   rosuvastatin 10 MG tablet Commonly known as: CRESTOR Take 1 tablet (10 mg total) by mouth daily.         Follow-up Information     [facility]. Go in 5 day(s).   Specialty: Cardiology Why: hOSPITAL FOLLOW UP 02/29/2024 @ 11 AM please BRING A CURRENT MEDICATION LIST TO APPOINTMENT Free valet parking entrance C, off [address],  ( Look for Women and Childrens entrance) Solicitor information: [address] [place]  [phone]        [facility] at [facility] Follow up.   Specialty: Rehabilitation Why: Please call and schedule your outpatient Speech Therapy Contact information: [address] [place]  [address] [phone]        Clearnce Curia, NP Follow up.   Specialty: Cardiology Why: Hospital follow-up with General Cardiology scheduled for 03/28/2024 at 2:45pm. Please arrive 15 minutes early for check-in. If this date/ time does not work for you, please call our office to reschedule. Contact information: [address] [place] [address] [phone]         Ozell Blunt, MD. Schedule an appointment as soon as possible for a visit in 1 week(s).   Specialty:  Internal Medicine Contact information: [address] [place] [address] [phone]                 Major procedures and Radiology Reports - PLEASE review detailed and final reports thoroughly  -       MR CARDIAC STRESS TEST Result Date: 02/22/2024 CLINICAL DATA:  49F p/w acute heart failure. Echo with EF 20-25%, moderate LVH, G3DD, moderate RV dysfunction, mild to moderate MR EXAM: CARDIAC MRI PROCEDURE: The patient was scanned on a 1.5 Tesla Siemens magnet. A dedicated cardiac coil was used. Functional imaging was done using Fiesta sequences. 2,3, and 4 chamber views  were done to assess for RWMA's. Modified Simpson's rule using a short axis stack was used to calculate an ejection fraction on a dedicated work [employer]. Rest and stress (following administration of regadenoson  0.4mg ) first-pass contrast-enhanced imaging was done. The patient received 10 cc of Gadavist . After 10 minutes inversion recovery sequences were used to assess for infiltration and scar tissue. Phase contrast velocity mapping was performed CONTRAST:  10 cc  of Gadavist  FINDINGS: Left ventricle: -Moderate asymmetric hypertrophy measuring 15mm in basal septum (10mm in posterior wall) -Moderate dilatation -Severe systolic dysfunction -Mild ECV elevation (31%) -Basal inferior perfusion defect on stress perfusion imaging -Subendocardial LGE in basal inferior wall consistent with prior infarct. LGE is greater than 50% transmural suggesting nonviability -Basal septal midwall LGE -RV insertion site LGE LV EF:  21% (Normal 52-79%) Absolute volumes: LV EDV: (Normal 78-167 mL) LV ESV: (Normal 21-64 mL) LV SV: 62mL (Normal 52-114 mL) CO: 4.6L/min (Normal 2.7-6.3 L/min) Indexed volumes: LV EDV: 175mL/sq-m (Normal 50-96 mL/sq-m) LV ESV: 138mL/sq-m (Normal 10-40 mL/sq-m) LV SV: 45mL/sq-m (Normal 33-64 mL/sq-m) CI: 2.4L/min/sq-m (Normal 1.9-3.9 L/min/sq-m) Right ventricle: Mild dilatation  with moderate systolic dysfunction RV EF: 31% (Normal 52-80%) Absolute volumes: RV EDV: (Normal 79-175 mL) RV ESV: (Normal 13-75 mL) RV SV: 61mL (Normal 56-110 mL) CO: 4.5L/min (Normal 2.7-6 L/min) Indexed volumes: RV EDV: 167mL/sq-m (Normal 51-97 mL/sq-m) RV ESV: 81mL/sq-m (Normal 9-42 mL/sq-m) RV SV: 12mL/sq-m (Normal 35-61 mL/sq-m) CI: 2.3L/min/sq-m (Normal 1.8-3.8 L/min/sq-m) Left atrium: Moderate enlargement Right atrium: Normal size Mitral valve: Not quantified due to artifact on phase contrast velocity mapping. Visually appears mild Aortic valve: Visually mild regurgitation Tricuspid valve: Visually mild regurgitation Pulmonic valve: Visually trivial regurgitation Aorta: Dilated ascending aorta measuring 41mm Pulmonary artery: Dilated main PA measuring 32mm Pericardium: Normal IMPRESSION: 1. 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 There is subendocardial LGE in basal inferior wall consistent with prior infarct. LGE is greater than 50% transmural suggesting nonviability. There is also basal septal midwall LGE which is a scar pattern seen in nonischemic cardiomyopathies and associated with worse prognosis. 2. Stress perfusion defect in basal inferior wall, corresponding to area of LGE on delayed enhancement imaging, consistent with prior infarct 3. RV insertion site LGE, which is a nonspecific scar pattern often seen in setting of elevated pulmonary pressures 4. Poor quality cine images, likely due to frequent ectopy, which can effect quantification of LV/RV volumes and EF 5.  Moderate LV dilatation with severe systolic dysfunction (EF 21%) 6. Moderate asymmetric LV hypertrophy measuring 15mm in basal septum (10mm in posterior wall). While wall thickness of 15mm meets criteria for hypertrophic cardiomyopathy, could also be due to poorly controlled hypertension 7.  Mild RV dilatation with moderate systolic dysfunction (EF 31%) 8.  Dilated ascending aorta  measuring 41mm 9.  Dilated main pulmonary artery measuring 32mm Electronically Signed   By: Carson Clara M.D.   On: 02/22/2024 16:04   ECHOCARDIOGRAM COMPLETE Result Date: 02/20/2024    ECHOCARDIOGRAM REPORT   Patient Name:   Regina Bell Astorino Date of Exam: 02/20/2024 Medical Rec #:  [id number]         Height:       65.0 in Accession #:    [id number]        Weight:       188.3 lb Date of Birth:  08-Jul-1941          BSA:          1.928 m Patient  Age:    83 years          BP:           185/92 mmHg Patient Gender: F                 HR:           80 bpm. Exam Location:  Inpatient Procedure: 2D Echo, Cardiac Doppler, Color Doppler and Intracardiac            Opacification Agent (Both Spectral and Color Flow Doppler were            utilized during procedure). Indications:    CHF  History:        Patient has no prior history of Echocardiogram examinations.                 CHF.  Sonographer:    Astrid Blamer Referring Phys: [id number] Gayl Katos MELVIN IMPRESSIONS  1. Left ventricular ejection fraction, by estimation, is 20 to 25%. The left ventricle has severely decreased function. The left ventricle demonstrates global hypokinesis. There is moderate concentric left ventricular hypertrophy. Left ventricular diastolic parameters are consistent with Grade III diastolic dysfunction (restrictive).  2. Right ventricular systolic function is moderately reduced. The right ventricular size is normal. There is normal pulmonary artery systolic pressure. The estimated right ventricular systolic pressure is 33.3 mmHg.  3. Left atrial size was severely dilated.  4. Right atrial size was mildly dilated.  5. Moderate pleural effusion in the left lateral region.  6. The mitral valve is degenerative. Mild to moderate mitral valve regurgitation. Mild mitral stenosis.  7. The aortic valve is tricuspid. There is mild calcification of the aortic valve. Aortic valve regurgitation is mild. Aortic valve sclerosis/calcification is present,  without any evidence of aortic stenosis.  8. The inferior vena cava is normal in size with greater than 50% respiratory variability, suggesting right atrial pressure of 3 mmHg. Conclusion(s)/Recommendation(s): Consider evalaution for infiltrative cardiomopathy, if clinically indicated. FINDINGS  Left Ventricle: Left ventricular ejection fraction, by estimation, is 20 to 25%. The left ventricle has severely decreased function. The left ventricle demonstrates global hypokinesis. Definity  contrast agent was given IV to delineate the left ventricular endocardial borders. The left ventricular internal cavity size was normal in size. There is moderate concentric left ventricular hypertrophy. Left ventricular diastolic parameters are consistent with Grade III diastolic dysfunction (restrictive). Right Ventricle: The right ventricular size is normal. No increase in right ventricular wall thickness. Right ventricular systolic function is moderately reduced. There is normal pulmonary artery systolic pressure. The tricuspid regurgitant velocity is 2.66 m/s, and with an assumed right atrial pressure of 5 mmHg, the estimated right ventricular systolic pressure is 33.3 mmHg. Left Atrium: Left atrial size was severely dilated. Right Atrium: Right atrial size was mildly dilated. Pericardium: There is no evidence of pericardial effusion. Mitral Valve: The mitral valve is degenerative in appearance. There is moderate calcification of the mitral valve leaflet(s). Mild to moderate mitral valve regurgitation. Mild mitral valve stenosis. MV peak gradient, 6.7 mmHg. The mean mitral valve gradient is 3.0 mmHg. Tricuspid Valve: The tricuspid valve is normal in structure. Tricuspid valve regurgitation is mild . No evidence of tricuspid stenosis. Aortic Valve: The aortic valve is tricuspid. There is mild calcification of the aortic valve. Aortic valve regurgitation is mild. Aortic valve sclerosis/calcification is present, without any evidence  of aortic stenosis. Aortic valve mean gradient measures 2.0 mmHg. Aortic valve peak gradient measures 4.2 mmHg. Aortic valve area, by  VTI measures 2.51 cm. Pulmonic Valve: The pulmonic valve was normal in structure. Pulmonic valve regurgitation is mild. No evidence of pulmonic stenosis. Aorta: The aortic root is normal in size and structure. Venous: The inferior vena cava is normal in size with greater than 50% respiratory variability, suggesting right atrial pressure of 3 mmHg. IAS/Shunts: No atrial level shunt detected by color flow Doppler. Additional Comments: There is a moderate pleural effusion in the left lateral region.  LEFT VENTRICLE PLAX 2D LVIDd:         5.40 cm   Diastology LVIDs:         4.90 cm   LV e' medial:    2.28 cm/s LV PW:         1.20 cm   LV E/e' medial:  60.1 LV IVS:        1.00 cm   LV e' lateral:   4.90 cm/s LVOT diam:     1.90 cm   LV E/e' lateral: 28.0 LV SV:         38 LV SV Index:   20 LVOT Area:     2.84 cm  RIGHT VENTRICLE RV S prime:     9.79 cm/s TAPSE (M-mode): 1.3 cm LEFT ATRIUM              Index        RIGHT ATRIUM           Index LA Vol (A2C):   90.2 ml  46.78 ml/m  RA Area:     19.20 cm LA Vol (A4C):   129.0 ml 66.91 ml/m  RA Volume:   43.30 ml  22.46 ml/m LA Biplane Vol: 109.0 ml 56.54 ml/m  AORTIC VALVE AV Area (Vmax):    2.06 cm AV Area (Vmean):   2.24 cm AV Area (VTI):     2.51 cm AV Vmax:           102.00 cm/s AV Vmean:          64.000 cm/s AV VTI:            0.150 m AV Peak Grad:      4.2 mmHg AV Mean Grad:      2.0 mmHg LVOT Vmax:         74.10 cm/s LVOT Vmean:        50.600 cm/s LVOT VTI:          0.133 m LVOT/AV VTI ratio: 0.89  AORTA Ao Root diam: 3.40 cm MITRAL VALVE                TRICUSPID VALVE MV Area (PHT): 3.87 cm     TR Peak grad:   28.3 mmHg MV Area VTI:   1.30 cm     TR Vmax:        266.00 cm/s MV Peak grad:  6.7 mmHg MV Mean grad:  3.0 mmHg     SHUNTS MV Vmax:       1.29 m/s     Systemic VTI:  0.13 m MV Vmean:      75.8 cm/s    Systemic Diam:  1.90 cm MV Decel Time: 196 msec MV E velocity: 137.00 cm/s MV A velocity: 77.40 cm/s MV E/A ratio:  1.77 Jules Oar MD Electronically signed by Jules Oar MD Signature Date/Time: 02/20/2024/6:47:21 PM    Final    VAS US  LOWER EXTREMITY VENOUS (DVT) Result Date: 02/20/2024  Lower Venous DVT Study Patient Name:  Regina Bell Bilyeu  Date of Exam:   02/20/2024 Medical Rec #: [id number]          Accession #:    [id number] Date of Birth: 1941-06-08           Patient Gender: F Patient Age:   61 years Exam Location:  [facility] Procedure:      VAS US  LOWER EXTREMITY VENOUS (DVT) Referring Phys: Kimberly Penna --------------------------------------------------------------------------------  Indications: Pulmonary embolism, and SOB.  Risk Factors: Confirmed PE. Comparison Study: No priors. Performing Technologist: Franky Ivanoff Sturdivant-Jones RDMS, RVT  Examination Guidelines: A complete evaluation includes B-mode imaging, spectral Doppler, color Doppler, and power Doppler as needed of all accessible portions of each vessel. Bilateral testing is considered an integral part of a complete examination. Limited examinations for reoccurring indications may be performed as noted. The reflux portion of the exam is performed with the patient in reverse Trendelenburg.  +---------+---------------+---------+-----------+----------+--------------+ RIGHT    CompressibilityPhasicitySpontaneityPropertiesThrombus Aging +---------+---------------+---------+-----------+----------+--------------+ CFV      Full           Yes      Yes                                 +---------+---------------+---------+-----------+----------+--------------+ SFJ      Full                                                        +---------+---------------+---------+-----------+----------+--------------+ FV Prox  Full                                                         +---------+---------------+---------+-----------+----------+--------------+ FV Mid   Full                                                        +---------+---------------+---------+-----------+----------+--------------+ FV DistalFull                                                        +---------+---------------+---------+-----------+----------+--------------+ PFV      Full                                                        +---------+---------------+---------+-----------+----------+--------------+ POP      Full           Yes      Yes                                 +---------+---------------+---------+-----------+----------+--------------+ PTV      Full                                                        +---------+---------------+---------+-----------+----------+--------------+  PERO     Full                                                        +---------+---------------+---------+-----------+----------+--------------+ 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  +---------+---------------+---------+-----------+----------+--------------+ LEFT     CompressibilityPhasicitySpontaneityPropertiesThrombus Aging +---------+---------------+---------+-----------+----------+--------------+ CFV      Full           Yes      Yes                                 +---------+---------------+---------+-----------+----------+--------------+ SFJ      Full                                                        +---------+---------------+---------+-----------+----------+--------------+ FV Prox  Full                                                        +---------+---------------+---------+-----------+----------+--------------+ FV Mid   Full                                                        +---------+---------------+---------+-----------+----------+--------------+ FV DistalFull                                                         +---------+---------------+---------+-----------+----------+--------------+ PFV      Full                                                        +---------+---------------+---------+-----------+----------+--------------+ POP      Full           Yes      Yes                                 +---------+---------------+---------+-----------+----------+--------------+ PTV      Full                                                        +---------+---------------+---------+-----------+----------+--------------+ PERO     Full                                                        +---------+---------------+---------+-----------+----------+--------------+  A large complex, hypoechoic non vascularized structure located in the left popliteal fossa measuring 8.2 x 6.0 x 4.8 cm. etiology is unknown.    Summary: BILATERAL: - No evidence of deep vein thrombosis seen in the lower extremities, bilaterally. -No evidence of popliteal cyst, bilaterally.   *See table(s) above for measurements and observations. Electronically signed by Jimmye Moulds MD on 02/20/2024 at 2:16:11 PM.    Final    IR THORACENTESIS ASP PLEURAL SPACE W/IMG GUIDE Result Date: 02/20/2024 INDICATION: 83 year old female with bilateral pleural effusions with left being greater than right for left diagnostic thoracentesis. EXAM: ULTRASOUND GUIDED LEFT THORACENTESIS MEDICATIONS: 10 mL 1% lidocaine  COMPLICATIONS: None immediate. PROCEDURE: An ultrasound guided thoracentesis was thoroughly discussed with the patient and questions answered. The benefits, risks, alternatives and complications were also discussed. The patient understands and wishes to proceed with the procedure. Written consent was obtained. Ultrasound was performed to localize and mark an adequate pocket of fluid in the left chest. The area was then prepped and draped in the normal sterile fashion. 1% Lidocaine  was used for local anesthesia. Under  ultrasound guidance a 6 Fr Safe-T-Centesis catheter was introduced. Thoracentesis was performed. The catheter was removed and a dressing applied. FINDINGS: A total of approximately 0 mL of clear, red fluid was removed. Samples were sent to the laboratory as requested by the clinical team. IMPRESSION: Successful ultrasound guided left thoracentesis yielding 0 mL of pleural fluid. Performed By Lorinda Root, PA-C Electronically Signed   By: Myrlene Asper D.O.   On: 02/20/2024 13:00   US  RENAL Result Date: 02/20/2024 CLINICAL DATA:  Acute kidney insufficiency EXAM: RENAL / URINARY TRACT ULTRASOUND COMPLETE COMPARISON:  Renal ultrasound 02/07/2017 FINDINGS: Right Kidney: Renal measurements: 11.8 x 4.9 x 6.3 cm = volume: 192.0 mL. No collecting system dilatation. Echogenic shadowing foci in the right kidney measuring 14 mm consistent with stones. Left Kidney: Renal measurements: 9.8 x 4.7 x 5.1 cm = volume: 122.6 mL. No collecting system dilatation. Echogenic shadowing areas in left kidney as well consistent with stones measuring up to 15 mm. Bladder: Appears normal for degree of bladder distention. Other: Bilateral small pleural effusions. IMPRESSION: Bilateral nonobstructing renal stones. No collecting system dilatation. Electronically Signed   By: Adrianna Horde M.D.   On: 02/20/2024 11:08   US  Abdomen Limited RUQ (LIVER/GB) Result Date: 02/20/2024 CLINICAL DATA:  Transaminitis EXAM: ULTRASOUND ABDOMEN LIMITED RIGHT UPPER QUADRANT COMPARISON:  Noncontrast CT 02/12/2017 FINDINGS: Gallbladder: Gallbladder is distended. No wall thickening, adjacent fluid. No reported Murphy's sign. Some echogenic lobular nonshadowing foci identified dependently, possible tumefactive sludge. Common bile duct: Diameter: 2 mm Liver: No focal lesion identified. Within normal limits in parenchymal echogenicity. Portal vein is patent on color Doppler imaging with normal direction of blood flow towards the liver. Other: Small right-sided  pleural effusion seen. IMPRESSION: Tumefactive sludge. No shadowing stones, wall thickening or ductal dilatation. Small right pleural effusion. Electronically Signed   By: Adrianna Horde M.D.   On: 02/20/2024 11:06   DG Chest 1 View Result Date: 02/20/2024 CLINICAL DATA:  Attempted left thoracentesis. EXAM: CHEST  1 VIEW COMPARISON:  Feb 19, 2023. FINDINGS: No definite pneumothorax is noted. Bilateral pleural effusions are again noted, left greater than right. IMPRESSION: Bilateral pleural effusions as noted above. Electronically Signed   By: Rosalene Colon M.D.   On: 02/20/2024 09:13   CT Angio Chest PE W and/or Wo Contrast Result Date: 02/19/2024 CLINICAL DATA:  Dyspnea. EXAM: CT ANGIOGRAPHY CHEST WITH CONTRAST TECHNIQUE:  Multidetector CT imaging of the chest was performed using the standard protocol during bolus administration of intravenous contrast. Multiplanar CT image reconstructions and MIPs were obtained to evaluate the vascular anatomy. RADIATION DOSE REDUCTION: This exam was performed according to the departmental dose-optimization program which includes automated exposure control, adjustment of the mA and/or kV according to patient size and/or use of iterative reconstruction technique. CONTRAST:  50mL OMNIPAQUE  IOHEXOL  350 MG/ML SOLN COMPARISON:  January 26, 2017. FINDINGS: Cardiovascular: Moderate cardiomegaly is noted. No pericardial effusion. Coronary artery calcifications are noted. Atherosclerosis of thoracic aorta is noted. 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 There is no evidence of large central pulmonary embolus seen in main pulmonary artery or proximal portions of left and right pulmonary arteries. There is limited opacification of the lower lobe branches bilaterally potentially due to timing of contrast bolus as well as surrounding atelectasis. Smaller and more peripheral pulmonary emboli these areas cannot be excluded. Mediastinum/Nodes: No enlarged mediastinal, hilar, or  axillary lymph nodes. Thyroid  gland, trachea, and esophagus demonstrate no significant findings. Lungs/Pleura: No pneumothorax is noted. Moderate bilateral pleural effusions are noted with associated atelectasis of both lower lobes. Upper Abdomen: No acute abnormality. Musculoskeletal: No chest wall abnormality. No acute or significant osseous findings. Review of the MIP images confirms the above findings. IMPRESSION: No definite evidence of large central pulmonary embolus seen in main pulmonary artery or proximal portions of left and right pulmonary arteries. However, there is very limited opacification of the lower lobe branches of both pulmonary arteries potentially due to timing of contrast bolus as well as surrounding atelectasis, and therefore smaller and more peripheral pulmonary emboli cannot be excluded on the basis of this exam. 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 Recommend annual imaging followup by CTA or MRA. This recommendation follows 2010 ACCF/AHA/AATS/ACR/ASA/SCA/SCAI/SIR/STS/SVM Guidelines for the Diagnosis and Management of Patients with Thoracic Aortic Disease. Circulation. 2010; 121: W119-J478. Aortic aneurysm NOS (ICD10-I71.9) Moderate bilateral pleural effusions are noted with adjacent atelectasis of both lower lobes. Enlarged main pulmonary artery is noted suggesting pulmonary artery hypertension. Moderate cardiomegaly. Coronary artery calcifications are noted suggesting coronary artery disease. Aortic Atherosclerosis (ICD10-I70.0). Electronically Signed   By: Rosalene Colon M.D.   On: 02/19/2024 14:05   DG Chest Port 1 View Result Date: 02/19/2024 CLINICAL DATA:  [id number] SOB (shortness of breath) [id number] EXAM: PORTABLE CHEST - 1 VIEW COMPARISON:  None available. FINDINGS: Retrocardiac airspace consolidation with blunting of the costophrenic sulcus. Blunting of the right costophrenic angle with patchy right basilar airspace opacities, also present. No pneumothorax. Skin fold artifact  along the right lung. Mild cardiomegaly. Tortuous aorta with aortic atherosclerosis. No acute fracture or destructive lesions. Multilevel thoracic osteophytosis. Osteopenia. IMPRESSION: Small bilateral pleural effusions, larger on the left than the right, with bibasilar airspace opacities, likely atelectasis. Electronically Signed   By: Rance Burrows M.D.   On: 02/19/2024 10:23    Micro Results   Recent Results (from the past 240 hours)  Resp panel by RT-PCR (RSV, Flu A&B, Covid) Anterior Nasal Swab     Status: None   Collection Time: 02/19/24  9:33 AM   Specimen: Anterior Nasal Swab  Result Value Ref Range Status   SARS Coronavirus 2 by RT PCR NEGATIVE NEGATIVE Final    Comment: (NOTE) SARS-CoV-2 target nucleic acids are NOT DETECTED.  The SARS-CoV-2 RNA is generally detectable in upper respiratory specimens during the acute phase of infection. The lowest concentration of SARS-CoV-2 viral copies this assay can detect is 138 copies/mL.  A negative result does not preclude SARS-Cov-2 infection and should not be used as the sole basis for treatment or other patient management decisions. A negative result may occur with  improper specimen collection/handling, submission of specimen other than nasopharyngeal swab, presence of viral mutation(s) within the areas targeted by this assay, and inadequate number of viral copies(<138 copies/mL). A negative result must be combined with clinical observations, patient history, and epidemiological information. The expected result is Negative.  Fact Sheet for Patients:  BloggerCourse.com  Fact Sheet for Healthcare Providers:  SeriousBroker.it  This test is no t yet approved or cleared by the [place]  FDA and  has been authorized for detection and/or diagnosis of SARS-CoV-2 by FDA under an Emergency Use Authorization (EUA). This EUA will remain  in effect (meaning this test can be used) for the  duration of the COVID-19 declaration under Section 564(b)(1) of the Act, 21 U.S.C.section 360bbb-3(b)(1), unless the authorization is terminated  or revoked sooner.       Influenza A by PCR NEGATIVE NEGATIVE Final   Influenza B by PCR NEGATIVE NEGATIVE Final    Comment: (NOTE) The Xpert Xpress SARS-CoV-2/FLU/RSV plus assay is intended as an aid in the diagnosis of influenza from Nasopharyngeal swab specimens and should not be used as a sole basis for treatment. Nasal washings and aspirates are unacceptable for Xpert Xpress SARS-CoV-2/FLU/RSV testing.  Fact Sheet for Patients: BloggerCourse.com  Fact Sheet for Healthcare Providers: SeriousBroker.it  This test is not yet approved or cleared by the [place]  FDA and has been authorized for detection and/or diagnosis of SARS-CoV-2 by FDA under an Emergency Use Authorization (EUA). This EUA will remain in effect (meaning this test can be used) for the duration of the COVID-19 declaration under Section 564(b)(1) of the Act, 21 U.S.C. section 360bbb-3(b)(1), unless the authorization is terminated or revoked.     Resp Syncytial Virus by PCR NEGATIVE NEGATIVE Final    Comment: (NOTE) Fact Sheet for Patients: BloggerCourse.com  Fact Sheet for Healthcare Providers: SeriousBroker.it  This test is not yet approved or cleared by the [place]  FDA and has been authorized for detection and/or diagnosis of SARS-CoV-2 by FDA under an Emergency Use Authorization (EUA). This EUA will remain in effect (meaning this test can be used) for the duration of the COVID-19 declaration under Section 564(b)(1) of the Act, 21 U.S.C. section 360bbb-3(b)(1), unless the authorization is terminated or revoked.  Performed at [facility], [address] [place], [place] [address]   Urine Culture (for pregnant, neutropenic or  urologic patients or patients with an indwelling urinary catheter)     Status: Abnormal   Collection Time: 02/19/24  5:26 PM   Specimen: Urine, Clean Catch  Result Value Ref Range Status   Specimen Description URINE, CLEAN CATCH  Final   Special Requests NONE  Final   Culture (A)  Final    <10,000 COLONIES/mL INSIGNIFICANT GROWTH Performed at [facility], [address] [place], [place] [address]    Report Status 02/22/2024 FINAL  Final    Today   Subjective    Clabe Crete today has no headache,no chest abdominal pain,no new weakness tingling or numbness, feels much better wants to go home today.     Objective   Blood pressure (!) 133/58, pulse 73, temperature (!) 97.4 F (36.3 C), temperature source Oral, resp. rate 18, height 5\' 5"  (1.651 m), weight 81.7 kg, SpO2 91%.   Intake/Output Summary (Last 24 hours) at 02/24/2024 0981 Last data  filed at 02/23/2024 1700 Gross per 24 hour  Intake 480 ml  Output 400 ml  Net 80 ml    Exam  Awake Alert, No new F.N deficits,    [place].AT,PERRAL Supple Neck,   Symmetrical Chest wall movement, Good air movement bilaterally, CTAB RRR,No Gallops,   +ve B.Sounds, Abd Soft, Non tender,  No Cyanosis, Clubbing or edema    Data Review   Recent Labs  Lab 02/19/24 0933 02/20/24 0428 02/21/24 0435  WBC 9.2 8.5 8.7  HGB 13.4 12.7 13.3  HCT 41.2 39.3 40.5  PLT 209 201 202  MCV 92.8 93.1 92.5  MCH 30.2 30.1 30.4  MCHC 32.5 32.3 32.8  RDW 14.1 14.1 13.8  LYMPHSABS 1.2  --   --   MONOABS 0.5  --   --   EOSABS 0.1  --   --   BASOSABS 0.0  --   --     Recent Labs  Lab 02/19/24 0932 02/19/24 0933 02/20/24 0428 02/21/24 0435 02/22/24 0402 02/23/24 0354  NA  --  141 140 141 139 140  K  --  4.2 3.5 3.6 3.2* 3.9  CL  --  104 105 99 100 101  CO2  --  21* 26 31 29 29   ANIONGAP  --  17* 9 11 10 10   GLUCOSE  --  126* 106* 90 94 92  BUN  --  39* 36* 30* 31* 28*  CREATININE  --  1.68* 1.68* 1.73* 1.65* 1.50*  AST  --  218*  150* 171* 79*  --   ALT  --  493* 377* 403* 278*  --   ALKPHOS  --  43 25* 29* 26*  --   BILITOT  --  1.1 0.9 1.1 1.0  --   ALBUMIN  --  3.6 2.9* 3.0* 2.9*  --   DDIMER 8.48*  --   --   --   --   --   MG  --   --  1.9  --  2.1 2.1  CALCIUM  --  9.7 8.7* 9.0 8.9 9.2    Total Time in preparing paper work, data evaluation and todays exam - 35 minutes  Signature  -    Lynnwood Sauer M.D on 02/24/2024 at 8:29 AM   -  To page go to [email or web address]

## 2024-02-24 NOTE — Discharge Instructions (Signed)
 Follow with Primary MD Ozell Blunt, MD in 7 days   Get CBC, CMP, 2 view Chest X ray -  checked next visit with your primary MD    Activity: As tolerated with Full fall precautions use walker/cane & assistance as needed  Disposition Home     Diet: Heart Healthy -  Check your Weight same time everyday, if you gain over 2 pounds, or you develop in leg swelling, experience more shortness of breath or chest pain, call your Primary MD immediately. Follow Cardiac Low Salt Diet and 1.5 lit/day fluid restriction.  Special Instructions: If you have smoked or chewed Tobacco  in the last 2 yrs please stop smoking, stop any regular Alcohol  and or any Recreational drug use.  On your next visit with your primary care physician please Get Medicines reviewed and adjusted.  Please request your Prim.MD to go over all Hospital Tests and Procedure/Radiological results at the follow up, please get all Hospital records sent to your Prim MD by signing hospital release before you go home.  If you experience worsening of your admission symptoms, develop shortness of breath, life threatening emergency, suicidal or homicidal thoughts you must seek medical attention immediately by calling 911 or calling your MD immediately  if symptoms less severe.  You Must read complete instructions/literature along with all the possible adverse reactions/side effects for all the Medicines you take and that have been prescribed to you. Take any new Medicines after you have completely understood and accpet all the possible adverse reactions/side effects.   Do not drive when taking Pain medications.  Do not take more than prescribed Pain, Sleep and Anxiety Medications  Wear Seat belts while driving.

## 2024-02-24 NOTE — Progress Notes (Signed)
 Discussed new medications and where to pick them up at [facility] on [address]. Removed IV, site clean and dry. Reviewed AVS and follow-up appointments. Education on diet and educated on the importance of weight and monitoring closely for when to contact provider. All questions were answered and patient and son verbalized understanding of discharge instructions.

## 2024-02-24 NOTE — Progress Notes (Signed)
 Dr. Jeral Moll note reviewed, agree with discharge summary.  Feels well this AM.   EF 25% biventricular heart failure, diuresed  Has close follow-up with CHF TOC clinic.  Cardiac MRI reviewed.  Mixed picture.  Unprovoked PE in 2018.  She has not been taking her Xarelto  for quite some time.  No further recurrence.  Xarelto  has been stopped.  Mild ascending aortic dilatation of 4.2 cm.  We will continue to monitor for now on annual basis to measure stability.  -Losartan 25 mg, metoprolol  12.5 mg a day -Agree with Lasix  40 mg a day -Crestor 10 mg a day for hyperlipidemia.  Continue monitor blood work as outpatient.  Dorothye Gathers, MD

## 2024-02-28 NOTE — Progress Notes (Addendum)
 HEART & VASCULAR TRANSITION OF CARE CONSULT NOTE     Referring Physician: Dr Dorothey Gate, Albin Altes, MD   Chief Complaint: Heart Failure   HPI: Referred to clinic by Dr Zelda Hickman for heart failure consultation.   Regina Bell is a 83 y.o. female chronic HFrEF, PE, HLD, and SVT.   Admitted 02/19/24 with acute HFrEF. CTA - no PE. Diuresed with IV lasix  and transitioned to po lasix . Developed SVT and was given adenosine  with conversion to SR.  Echo EF 20-25% and RV moderately reduced. 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 There is also basal septal midwall LGE which is a scar, NICM. GDMT 2/4 pillars of HF with ARB and BB.    She is here today with her daughter. Overall feeling fine. Denies SOB/PND/Orthopnea.  No chest pain. Appetite ok. No fever or chills. Weight at home has been stable. Taking all medications. Lives alone. She plans to return to work with her son in few weeks. 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 She wants to return to work.   Current Outpatient Medications  Medication Sig Dispense Refill   furosemide  (LASIX ) 40 MG tablet Take 1 tablet (40 mg total) by mouth daily. 30 tablet 0   losartan  (COZAAR ) 25 MG tablet Take 1 tablet (25 mg total) by mouth daily. 30 tablet 0   metoprolol  succinate (TOPROL -XL) 25 MG 24 hr tablet Take 0.5 tablets (12.5 mg total) by mouth daily. 30 tablet 0   OVER THE COUNTER MEDICATION Take 2 tablets by mouth every morning. *otc called blood flow*     rosuvastatin  (CRESTOR ) 10 MG tablet Take 1 tablet (10 mg total) by mouth daily. 30 tablet 0   No current facility-administered medications for this encounter.    No Known Allergies    Social History   Socioeconomic History   Marital status: Widowed    Spouse name: Not on file   Number of children: 2   Years of education: Not on file   Highest education level: High school graduate   Occupational History   Not on file  Tobacco Use   Smoking status: Former   Smokeless tobacco: Never  Vaping Use   Vaping status: Never Used  Substance and Sexual Activity   Alcohol use: No   Drug use: No   Sexual activity: Not on file  Other Topics Concern   Not on file  Social History Narrative   ** Merged History Encounter **       Social Drivers of Health   Financial Resource Strain: Low Risk  (02/21/2024)   Overall Financial Resource Strain (CARDIA)    Difficulty of Paying Living Expenses: Not very hard  Food Insecurity: No Food Insecurity (02/20/2024)   Hunger Vital Sign    Worried About Running Out of Food in the Last Year: Never true    Ran Out of Food in the Last Year: Never true  Transportation Needs: No Transportation Needs (02/20/2024)   PRAPARE - Administrator, Civil Service (Medical): No    Lack of Transportation (Non-Medical): No  Physical Activity: Not on file  Stress: Not on file  Social Connections: Socially Integrated (02/21/2024)   Social Connection and Isolation Panel [NHANES]    Frequency of Communication with Friends and Family: Three times a week    Frequency of Social Gatherings with Friends and Family: Three times a week  Attends Religious Services: More than 4 times per year    Active Member of Clubs or Organizations: Yes    Attends Banker Meetings: More than 4 times per year    Marital Status: Married  Catering manager Violence: Not At Risk (02/20/2024)   Humiliation, Afraid, Rape, and Kick questionnaire    Fear of Current or Ex-Partner: No    Emotionally Abused: No    Physically Abused: No    Sexually Abused: No     No family history on file.  Vitals:   02/29/24 1117  BP: (!) 146/60  Pulse: 67  SpO2: 94%  Weight: 78.4 kg (172 lb 12.8 oz)   Wt Readings from Last 3 Encounters:  02/29/24 78.4 kg (172 lb 12.8 oz)  02/22/24 81.7 kg (180 lb 1.9 oz)  01/26/17 103.4 kg (228 lb)    PHYSICAL EXAM: General:   No  resp difficulty Neck: supple. no JVD.  Cor: PMI nondisplaced. Regular rate & rhythm. No rubs, gallops or murmurs. Lungs: clear Abdomen: soft, nontender, nondistended.  Extremities: no cyanosis, clubbing, rash, edema Neuro: alert & oriented x3  ASSESSMENT & PLAN: 1. Chronic HFrEF , Mixed Presentation NICM/ICM Stress MRI suggestive of mixed etiology. She has not had cath. May need to consider.  Echo EF 20-25%. RV moderately reduced. Grade III DD.  NYHA II. Appears euvolemic. Change lasix  to as needed.  Instructed to take for 3 pound weight gain in 24 hours.  GDMT  BB- Continue Toprol  XL 12.5 mg daily  Ace/ARB/ARNI- Continue losartan  12.5 mg dialy  MRA- Add next visit.  SGLT2i- Start Jardiance 10 mg daily  Check BMET   2. CAD She has not had had heart cath. Stress MRI with scar suggestive previous MI.  - No chest pain. May to consider cath.  - Continue bb  - Continue statin.   3. Paroxysmal SVT Had  SVT recent admission. Converted with adenosine .  Regular on exam. If has recurrence will need Zio + EP referral Continue BB.   I am referring to Cardiac Rehab and will continue in HF clinic with Dr Mitzie Anda as new patient.  Referred to HFSW (PCP, Medications, Transportation, ETOH Abuse, Drug Abuse, Insurance, Financial ):  No Refer to Pharmacy:  No Refer to Home Health: No Refer to [facility]: Yes Dr Mitzie Anda    Follow up with Dr Mitzie Anda in a few weeks. Discussed with Dr Mitzie Anda. I spent 45 minutes reviewing records, interviewing/examining patient, and managing orders.     Cai Flott NP-C  3:39 PM'

## 2024-02-29 ENCOUNTER — Other Ambulatory Visit (HOSPITAL_COMMUNITY): Payer: Self-pay

## 2024-02-29 ENCOUNTER — Ambulatory Visit (HOSPITAL_COMMUNITY)
Admit: 2024-02-29 | Discharge: 2024-02-29 | Disposition: A | Discharge status: Home / Self Care | Attending: Adult Health | Admitting: Adult Health

## 2024-02-29 VITALS — BP 146/60 | HR 67 | Wt 172.8 lb

## 2024-02-29 DIAGNOSIS — Z79899 Other long term (current) drug therapy: Secondary | ICD-10-CM | POA: Diagnosis not present

## 2024-02-29 DIAGNOSIS — I5020 Unspecified systolic (congestive) heart failure: Secondary | ICD-10-CM

## 2024-02-29 DIAGNOSIS — Z7984 Long term (current) use of oral hypoglycemic drugs: Secondary | ICD-10-CM | POA: Insufficient documentation

## 2024-02-29 DIAGNOSIS — I5022 Chronic systolic (congestive) heart failure: Secondary | ICD-10-CM | POA: Diagnosis not present

## 2024-02-29 DIAGNOSIS — I252 Old myocardial infarction: Secondary | ICD-10-CM | POA: Insufficient documentation

## 2024-02-29 DIAGNOSIS — I2510 Atherosclerotic heart disease of native coronary artery without angina pectoris: Secondary | ICD-10-CM | POA: Diagnosis not present

## 2024-02-29 DIAGNOSIS — I428 Other cardiomyopathies: Secondary | ICD-10-CM | POA: Diagnosis not present

## 2024-02-29 DIAGNOSIS — I4719 Other supraventricular tachycardia: Secondary | ICD-10-CM | POA: Diagnosis not present

## 2024-02-29 LAB — BASIC METABOLIC PANEL WITH GFR
Anion gap: 11 (ref 5–15)
BUN: 37 mg/dL — ABNORMAL HIGH (ref 8–23)
CO2: 30 mmol/L (ref 22–32)
Calcium: 9.3 mg/dL (ref 8.9–10.3)
Chloride: 98 mmol/L (ref 98–111)
Creatinine, Ser: 2.02 mg/dL — ABNORMAL HIGH (ref 0.44–1.00)
GFR, Estimated: 24 mL/min — ABNORMAL LOW (ref >60)
Glucose, Bld: 127 mg/dL — ABNORMAL HIGH (ref 70–99)
Potassium: 3.9 mmol/L (ref 3.5–5.1)
Sodium: 139 mmol/L (ref 135–145)

## 2024-02-29 MED ORDER — FUROSEMIDE 40 MG PO TABS: 40.0000 mg | ORAL_TABLET | Freq: Every day | ORAL | 0 refills | Status: DC | PRN

## 2024-02-29 MED ORDER — EMPAGLIFLOZIN 10 MG PO TABS: 10.0000 mg | ORAL_TABLET | Freq: Every day | ORAL | 5 refills | Status: DC

## 2024-02-29 NOTE — Patient Instructions (Addendum)
 Medication Changes:  TAKE LASIX  *(FUROSEMIDE )* ONCE DAILY AS NEEDED FOR 3 POUND WEIGHT GAIN   START JARDIANCE 10MG  ONCE DAILY   Lab Work:  Labs done today, your results will be available in MyChart, we will contact you for abnormal readings.  Referrals:  YOU HAVE BEEN REFERRED TO CARDIAC REHAB THEY WILL REACH OUT TO YOU OR CALL TO ARRANGE THIS. PLEASE CALL US  WITH ANY CONCERNS   Follow-Up in AS SCHEDULED WITH DR. Mitzie Anda   At the [facility], you and your health needs are our priority. We have a designated team specialized in the treatment of Heart Failure. This Care Team includes your primary Heart Failure Specialized Cardiologist (physician), Advanced Practice Providers (APPs- Physician Assistants and Nurse Practitioners), and Pharmacist who all work together to provide you with the care you need, when you need it.   You may see any of the following providers on your designated Care Team at your next follow up:  Dr. Jules Oar Dr. Peder Bourdon Dr. Alwin Baars Dr. Judyth Nunnery Nieves Bars, NP Ruddy Corral, [place] [facility] [place], [place] Dennise Fitz, NP [place] Lee, NP Luster Salters, PharmD   Please be sure to bring in all your medications bottles to every appointment.   Need to Contact Us :  If you have any questions or concerns before your next appointment please send us  a message through [place] or call our office at [phone].    TO LEAVE A MESSAGE FOR THE NURSE SELECT OPTION 2, PLEASE LEAVE A MESSAGE INCLUDING: YOUR NAME DATE OF BIRTH CALL BACK NUMBER REASON FOR CALL**this is important as we prioritize the call backs  YOU WILL RECEIVE A CALL BACK THE SAME DAY AS LONG AS YOU CALL BEFORE 4:00 PM

## 2024-03-03 ENCOUNTER — Ambulatory Visit (HOSPITAL_COMMUNITY): Payer: Self-pay | Admitting: Adult Health

## 2024-03-12 ENCOUNTER — Telehealth (HOSPITAL_COMMUNITY): Payer: Self-pay

## 2024-03-12 NOTE — Telephone Encounter (Signed)
 Pt insurance is active and benefits verified through [facility] Medicare. Co-pay $0.00, DED $0.00/$0.00 met, out of pocket $3,900.00/$20.00 met, co-insurance 0%. No pre-authorization required. Passport, 03/12/24 @ 2:52PM, REF#[id number]   How many CR sessions are covered? (36 visits for TCR, 72 visits for ICR)72 Is this a lifetime maximum or an annual maximum? Annual Has the member used any of these services to date? No Is there a time limit (weeks/months) on start of program and/or program completion? No     Will contact patient to see if she is interested in the Cardiac Rehab Program.

## 2024-03-12 NOTE — Telephone Encounter (Signed)
 Attempted to call patient in regards to Cardiac Rehab - LM on VM

## 2024-03-13 ENCOUNTER — Other Ambulatory Visit (HOSPITAL_COMMUNITY)

## 2024-03-14 ENCOUNTER — Telehealth (HOSPITAL_COMMUNITY): Payer: Self-pay | Admitting: Cardiology

## 2024-03-14 NOTE — Telephone Encounter (Signed)
 Called to confirm/remind patient of their appointment at the [facility] on 03/14/24.   Appointment:   [] Confirmed  [x] Left mess   [] No answer/No voice mail  [] VM Full/unable to leave message  [] Phone not in service  Patient reminded to bring all medications and/or complete list.  Confirmed patient has transportation. Gave directions, instructed to utilize valet parking.

## 2024-03-17 ENCOUNTER — Other Ambulatory Visit (HOSPITAL_COMMUNITY): Payer: Self-pay

## 2024-03-17 ENCOUNTER — Ambulatory Visit (HOSPITAL_COMMUNITY): Payer: Self-pay | Admitting: Cardiology

## 2024-03-17 ENCOUNTER — Ambulatory Visit (HOSPITAL_COMMUNITY)
Admission: RE | Admit: 2024-03-17 | Discharge: 2024-03-17 | Disposition: A | Discharge status: Home / Self Care | Source: Ambulatory Visit | Attending: Cardiology | Admitting: Cardiology

## 2024-03-17 ENCOUNTER — Inpatient Hospital Stay (HOSPITAL_COMMUNITY)
Admission: RE | Admit: 2024-03-17 | Discharge: 2024-03-17 | Disposition: A | Discharge status: Home / Self Care | Source: Ambulatory Visit | Attending: Cardiology | Admitting: Cardiology

## 2024-03-17 ENCOUNTER — Encounter (HOSPITAL_COMMUNITY): Payer: Self-pay | Admitting: Cardiology

## 2024-03-17 ENCOUNTER — Other Ambulatory Visit (HOSPITAL_BASED_OUTPATIENT_CLINIC_OR_DEPARTMENT_OTHER): Payer: Self-pay

## 2024-03-17 ENCOUNTER — Other Ambulatory Visit (HOSPITAL_COMMUNITY): Payer: Self-pay | Admitting: Cardiology

## 2024-03-17 VITALS — BP 140/70 | HR 77 | Wt 178.8 lb

## 2024-03-17 DIAGNOSIS — I5020 Unspecified systolic (congestive) heart failure: Secondary | ICD-10-CM

## 2024-03-17 DIAGNOSIS — E7800 Pure hypercholesterolemia, unspecified: Secondary | ICD-10-CM | POA: Insufficient documentation

## 2024-03-17 DIAGNOSIS — Z86711 Personal history of pulmonary embolism: Secondary | ICD-10-CM | POA: Insufficient documentation

## 2024-03-17 DIAGNOSIS — N183 Chronic kidney disease, stage 3 unspecified: Secondary | ICD-10-CM | POA: Diagnosis not present

## 2024-03-17 DIAGNOSIS — R030 Elevated blood-pressure reading, without diagnosis of hypertension: Secondary | ICD-10-CM | POA: Insufficient documentation

## 2024-03-17 DIAGNOSIS — Z87891 Personal history of nicotine dependence: Secondary | ICD-10-CM | POA: Insufficient documentation

## 2024-03-17 DIAGNOSIS — I252 Old myocardial infarction: Secondary | ICD-10-CM | POA: Insufficient documentation

## 2024-03-17 DIAGNOSIS — I5022 Chronic systolic (congestive) heart failure: Secondary | ICD-10-CM | POA: Insufficient documentation

## 2024-03-17 DIAGNOSIS — I2510 Atherosclerotic heart disease of native coronary artery without angina pectoris: Secondary | ICD-10-CM | POA: Diagnosis not present

## 2024-03-17 DIAGNOSIS — I491 Atrial premature depolarization: Secondary | ICD-10-CM | POA: Insufficient documentation

## 2024-03-17 DIAGNOSIS — Z79899 Other long term (current) drug therapy: Secondary | ICD-10-CM | POA: Diagnosis not present

## 2024-03-17 DIAGNOSIS — I459 Conduction disorder, unspecified: Secondary | ICD-10-CM | POA: Diagnosis not present

## 2024-03-17 DIAGNOSIS — I471 Supraventricular tachycardia, unspecified: Secondary | ICD-10-CM

## 2024-03-17 DIAGNOSIS — Z7982 Long term (current) use of aspirin: Secondary | ICD-10-CM | POA: Diagnosis not present

## 2024-03-17 DIAGNOSIS — I429 Cardiomyopathy, unspecified: Secondary | ICD-10-CM | POA: Insufficient documentation

## 2024-03-17 LAB — BRAIN NATRIURETIC PEPTIDE: B Natriuretic Peptide: 1471.2 pg/mL — ABNORMAL HIGH (ref 0.0–100.0)

## 2024-03-17 LAB — BASIC METABOLIC PANEL WITH GFR
Anion gap: 8 (ref 5–15)
BUN: 25 mg/dL — ABNORMAL HIGH (ref 8–23)
CO2: 26 mmol/L (ref 22–32)
Calcium: 9.3 mg/dL (ref 8.9–10.3)
Chloride: 105 mmol/L (ref 98–111)
Creatinine, Ser: 1.57 mg/dL — ABNORMAL HIGH (ref 0.44–1.00)
GFR, Estimated: 33 mL/min — ABNORMAL LOW (ref >60)
Glucose, Bld: 98 mg/dL (ref 70–99)
Potassium: 4.8 mmol/L (ref 3.5–5.1)
Sodium: 139 mmol/L (ref 135–145)

## 2024-03-17 LAB — HEMOGLOBIN A1C
Hgb A1c MFr Bld: 6.2 % — ABNORMAL HIGH (ref 4.8–5.6)
Mean Plasma Glucose: 131.24 mg/dL

## 2024-03-17 MED ORDER — ASPIRIN 81 MG PO TBEC: 81.0000 mg | DELAYED_RELEASE_TABLET | Freq: Every day | ORAL | 3 refills | Status: AC

## 2024-03-17 MED ORDER — ENTRESTO 24-26 MG PO TABS
1.0000 | ORAL_TABLET | Freq: Two times a day (BID) | ORAL | 11 refills | Status: DC
  Filled 2024-03-17: qty 60, 30d supply, fill #0
  Filled 2024-04-12: qty 60, 30d supply, fill #1
  Filled 2024-05-16: qty 60, 30d supply, fill #2

## 2024-03-17 MED ORDER — ROSUVASTATIN CALCIUM 40 MG PO TABS
40.0000 mg | ORAL_TABLET | Freq: Every day | ORAL | 3 refills | Status: DC
Start: 2024-03-17 — End: 2024-06-09
  Filled 2024-03-17: qty 90, 90d supply, fill #0
  Filled 2024-05-16 – 2024-05-24 (×2): qty 90, 90d supply, fill #1

## 2024-03-17 NOTE — Progress Notes (Signed)
 PCP: Ozell Blunt, MD HF Cardiology: Dr. Mitzie Anda   Chief complaint: CHF  83 y.o. with history of PE in 2018, CKD stage 3, SVT, and chronic systolic CHF was referred from [facility] to HF physician's clinic for evaluation of CHF.  Patient had a non-triggered PE in 2018; she is no longer on anticoagulation and has not had a recurrence. In 5/25, she was admitted with progressive exertional dyspnea over about a week.  No chest pain.  Echo showed EF 20-25%, moderate LVH, moderate RV dysfunction, mild-moderate MR.  Troponin was mildly elevated but there was no trend. 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 Perfusion defect on stress images in the basal inferior wall (fixed, suggestive of prior MI). She was diuresed and felt better.  While in the hospital, she had SVT terminated by adenosine .   She seems to be doing well overall at home. BP is elevated today and weight is higher.  Recent creatinine was up to 2.02.  No significant exertional dyspnea.  She is back to work as a Scientist, physiological at [employer].  No palpitations or lightheadedness. No chest pain.  No orthopnea/PND.  She has not taken any Lasix .   Labs (5/25): K 3.9, creatinine 2.02, LDL 268, HS-TnI 288 => 286  ECG (personally reviewed): NSR, IVCD 126 msec  PMH: 1. CKD stage 3 2. PE: 2018, not triggered.  She is no longer anticoagulated.  3. Hyperlipidemia 4. H/o SVT 5. Chronic systolic CHF:  Echo in 5/25 with EF 20-25%, moderate LVH, moderate RV dysfunction, mild-moderate MR.  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 Perfusion defect on stress images in the basal inferior wall (fixed,  suggestive of prior MI).   FH: No history of cardiomyopathy or CAD that she knows of.  Has h/o HTN.   SH: Widowed, lives in [place]. No ETOH.  Remote smoking.  Works as Scientist, physiological at [employer].   ROS: All systems reviewed and negative except as per HPI.   Current Outpatient Medications  Medication Sig Dispense Refill   aspirin EC 81 MG tablet Take 1 tablet (81 mg total) by mouth daily. Swallow whole. 90 tablet 3   empagliflozin  (JARDIANCE ) 10 MG TABS tablet Take 1 tablet (10 mg total) by mouth daily before breakfast. 30 tablet 5   furosemide  (LASIX ) 40 MG tablet Take 1 tablet (40 mg total) by mouth daily as needed (for weight gain of 3 pounds). 30 tablet 0   metoprolol  succinate (TOPROL -XL) 25 MG 24 hr tablet Take 0.5 tablets (12.5 mg total) by mouth daily. 30 tablet 0   OVER THE COUNTER MEDICATION Take 2 tablets by mouth every morning. *otc called blood flow*     sacubitril -valsartan  (ENTRESTO ) 24-26 MG Take 1 tablet by mouth 2 (two) times daily. 60 tablet 11   rosuvastatin  (CRESTOR ) 40 MG tablet Take 1 tablet (40 mg total) by mouth daily. 90 tablet 3   No current facility-administered medications for this encounter.   BP (!) 140/70   Pulse 77   Wt 81.1 kg (178 lb 12.8 oz)   SpO2 96%   BMI 29.75 kg/m  General: NAD Neck: No JVD, no thyromegaly or thyroid  nodule.  Lungs: Clear to auscultation bilaterally with normal  respiratory effort. CV: Nondisplaced PMI.  Heart regular S1/S2, no S3/S4, no murmur.  No peripheral edema.  No carotid bruit.  Normal pedal pulses.  Abdomen: Soft, nontender, no hepatosplenomegaly, no distention.  Skin: Intact without lesions or rashes.  Neurologic: Alert and oriented x 3.  Psych: Normal affect. Extremities: No clubbing or cyanosis.  HEENT: Normal.   Assessment/Plan: 1. Chronic systolic CHF: Cardiomyopathy of uncertain etiology.  Echo in 5/25 showed EF 20-25%, moderate LVH, moderate RV dysfunction, mild-moderate MR.  Stress cardiac MRI in  5/25 showed LVEF 21%, RVEF 31%, subendocardial LGE (>50% wall thickness) basal inferior wall suggestive of prior MI, basal septal mid-wall LGE (nonspecific, can see with dilated cardiomyopathy). Perfusion defect on stress images in the basal inferior wall (fixed, suggestive of prior MI).  No pre-existing chest pain, troponin mildly elevated with no trend when admitted in 5/25.  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  She does not look volume overloaded on exam, NYHA class II symptoms.  Situation complicated by apparent CKD stage 3.  - Stop losartan  today and start Entresto  24/26 bid, BMET/BNP today and BMET in 10 days.  - Continue Jardiance  10 mg daily.  - Continue Toprol  XL 12.5 daily.  - Next step will be addition of spironolactone.  - She does not look like she needs Lasix  at this time.  - I will arrange for LHC/RHC to assess for coronary disease and to assess filling pressures/cardiac output.  2. CAD: Stress cardiac MRI is suggestive of prior inferior MI as above.  No chest pain.  Troponin was mildly elevated with no trend at last admission, more consistent with demand ischemia than ACS.   - Coronary angiography as above.  - Start ASA 81 daily.  - Increase Crestor  to 40 mg daily with lipids/LFTs in 2 months given LDL 268 in 5/25.  3. SVT: Patient noted to have asymptomatic SVT episode, terminated by adenosine  in 5/25.  - I will arrange for Zio monitor to rule out frequent arrhythmias that could contribute to cardiomyopathy.  - Continue Toprol  XL 12.5 mg daily.  4. H/o PE: In 2018, not triggered.  No recurrence.  5. CKD stage 3: Last creatinine 2.0.   - Will get BMET today, follow closely leading up to cath (will get contrast load).  6. Hyperlipidemia: LDL markedly high.   - Increasing Crestor  as above.   Followup with me in 5-6 wks.   I spent 56 minutes reviewing records, interviewing/examining patient, and managing orders.   Regina Bell 03/17/2024

## 2024-03-17 NOTE — Patient Instructions (Signed)
 STOP Losartan .  INCREASE Crestor  to 40 mg daily.  START Entresto  24/26 mg Twice daily  START Asprin 81 mg daily.  Labs done today, your results will be available in MyChart, we will contact you for abnormal readings.  Repeat blood work in a week.  You have been referred to Cardiac rehab, they will call you to arrange your appointment.  Your provider has recommended that  you wear a Zio Patch for 14 days.  This monitor will record your heart rhythm for our review.  IF you have any symptoms while wearing the monitor please press the button.  If you have any issues with the patch or you notice a red or orange light on it please call the company at [phone].  Once you remove the patch please mail it back to the company as soon as possible so we can get the results.  You are scheduled for a Cardiac Catheterization on Thursday, July 10 with Dr. Peder Bourdon.  1. Please arrive at the [facility] (Main Entrance A) at [facility]: [address] [place], [place] [address] at 5:30 AM (This time is 2 hour(s) before your procedure to ensure your preparation).   Free valet parking service is available. You will check in at ADMITTING. The support person will be asked to wait in the waiting room.  It is OK to have someone drop you off and come back when you are ready to be discharged.    Special note: Every effort is made to have your procedure done on time. Please understand that emergencies sometimes delay scheduled procedures.  2. Diet: Do not eat solid foods after midnight.  The patient may have clear liquids until 5am upon the day of the procedure.  3. Medication instructions in preparation for your procedure:   Contrast Allergy: No  HOLD JARDIANCE  AND LASIX  THE MORNING OF YOUR PROCEDURE  On the morning of your procedure, take your Aspirin 81 mg and any morning medicines NOT listed above.  You may use sips of water.  5. Plan to go home the same day, you will only stay overnight  if medically necessary. 6. Bring a current list of your medications and current insurance cards. 7. You MUST have a responsible person to drive you home. 8. Someone MUST be with you the first 24 hours after you arrive home or your discharge will be delayed. 9. Please wear clothes that are easy to get on and off and wear slip-on shoes.  Thank you for allowing us  to care for you!   -- [facility]

## 2024-03-18 ENCOUNTER — Other Ambulatory Visit (HOSPITAL_BASED_OUTPATIENT_CLINIC_OR_DEPARTMENT_OTHER): Payer: Self-pay

## 2024-03-18 ENCOUNTER — Ambulatory Visit (HOSPITAL_COMMUNITY): Admitting: Cardiology

## 2024-03-18 ENCOUNTER — Telehealth (HOSPITAL_COMMUNITY): Payer: Self-pay | Admitting: Pharmacy Technician

## 2024-03-18 NOTE — Telephone Encounter (Signed)
 Pharmacy Patient Advocate Encounter  Insurance verification completed.   The patient is insured through [employer] test claim for Entresto . Currently a quantity of 180 is a 90 day supply and the co-pay is $141 (30 days is $47) .  This test claim was processed through [facility]- copay amounts may vary at other pharmacies due to pharmacy/plan contracts, or as the patient moves through the different stages of their insurance plan.   Correne Dillon, CPhT

## 2024-03-19 ENCOUNTER — Encounter (HOSPITAL_COMMUNITY): Payer: Self-pay

## 2024-03-19 ENCOUNTER — Telehealth (HOSPITAL_COMMUNITY): Payer: Self-pay

## 2024-03-19 NOTE — Telephone Encounter (Signed)
 Attempted to call patient in regards to Cardiac Rehab - LM on VM Mailed letter

## 2024-03-20 ENCOUNTER — Other Ambulatory Visit (HOSPITAL_BASED_OUTPATIENT_CLINIC_OR_DEPARTMENT_OTHER): Payer: Self-pay

## 2024-03-24 ENCOUNTER — Telehealth: Payer: Self-pay | Admitting: Family

## 2024-03-24 ENCOUNTER — Ambulatory Visit (HOSPITAL_COMMUNITY)
Admission: RE | Admit: 2024-03-24 | Discharge: 2024-03-24 | Disposition: A | Discharge status: Home / Self Care | Source: Ambulatory Visit | Attending: Cardiology | Admitting: Cardiology

## 2024-03-24 DIAGNOSIS — I5020 Unspecified systolic (congestive) heart failure: Secondary | ICD-10-CM | POA: Insufficient documentation

## 2024-03-24 LAB — BASIC METABOLIC PANEL WITH GFR
Anion gap: 9 (ref 5–15)
BUN: 25 mg/dL — ABNORMAL HIGH (ref 8–23)
CO2: 25 mmol/L (ref 22–32)
Calcium: 9.4 mg/dL (ref 8.9–10.3)
Chloride: 106 mmol/L (ref 98–111)
Creatinine, Ser: 1.55 mg/dL — ABNORMAL HIGH (ref 0.44–1.00)
GFR, Estimated: 33 mL/min — ABNORMAL LOW (ref >60)
Glucose, Bld: 106 mg/dL — ABNORMAL HIGH (ref 70–99)
Potassium: 4.4 mmol/L (ref 3.5–5.1)
Sodium: 140 mmol/L (ref 135–145)

## 2024-03-24 NOTE — Telephone Encounter (Signed)
 Pt daughter called in stating pt was seen in CHF clinic last week. She asked if its necessary for her to keep appt with Caitlin, NP this week.

## 2024-03-24 NOTE — Telephone Encounter (Signed)
 Left message to call back

## 2024-03-25 NOTE — Telephone Encounter (Signed)
 Clearnce Curia, NP to Me    03/24/24  6:08 PM She is having thorough work up & med changes with Dr Mitzie Anda in Heart Failure Clinic. Okay to cancel visit with me. Recommend follow up with general cardiology in 3-4 mos.    Neomi Banks, NP  Advised and rescheduled follow up

## 2024-03-28 ENCOUNTER — Ambulatory Visit (HOSPITAL_BASED_OUTPATIENT_CLINIC_OR_DEPARTMENT_OTHER): Admitting: Family

## 2024-04-01 DIAGNOSIS — I471 Supraventricular tachycardia, unspecified: Secondary | ICD-10-CM | POA: Diagnosis not present

## 2024-04-03 ENCOUNTER — Telehealth (HOSPITAL_COMMUNITY): Payer: Self-pay

## 2024-04-03 NOTE — Telephone Encounter (Signed)
 Attempted follow-up call regarding cardiac rehab- no answer, left message. Informed patient we have been trying to reach her for a couple weeks now and will go ahead and close her referral, and if she changes his mind or has any questions to call us  back. Also sent MyChart message.

## 2024-04-06 ENCOUNTER — Ambulatory Visit (HOSPITAL_COMMUNITY): Payer: Self-pay | Admitting: Cardiology

## 2024-04-10 NOTE — Telephone Encounter (Signed)
 Pt aware, agreeable, and verbalized understanding

## 2024-04-12 ENCOUNTER — Other Ambulatory Visit (HOSPITAL_BASED_OUTPATIENT_CLINIC_OR_DEPARTMENT_OTHER): Payer: Self-pay

## 2024-04-22 DIAGNOSIS — I509 Heart failure, unspecified: Secondary | ICD-10-CM

## 2024-04-23 ENCOUNTER — Inpatient Hospital Stay (HOSPITAL_COMMUNITY): Admission: RE | Admit: 2024-04-23 | Source: Ambulatory Visit | Admitting: Cardiology

## 2024-04-24 ENCOUNTER — Ambulatory Visit (HOSPITAL_COMMUNITY): Admission: RE | Admit: 2024-04-24 | Source: Home / Self Care | Admitting: Cardiology

## 2024-04-24 ENCOUNTER — Encounter (HOSPITAL_COMMUNITY): Admission: RE | Payer: Self-pay | Source: Home / Self Care

## 2024-04-24 DIAGNOSIS — I509 Heart failure, unspecified: Secondary | ICD-10-CM

## 2024-04-24 SURGERY — RIGHT/LEFT HEART CATH AND CORONARY ANGIOGRAPHY
Anesthesia: LOCAL

## 2024-05-16 ENCOUNTER — Other Ambulatory Visit (HOSPITAL_BASED_OUTPATIENT_CLINIC_OR_DEPARTMENT_OTHER): Payer: Self-pay

## 2024-05-21 ENCOUNTER — Telehealth (HOSPITAL_COMMUNITY): Payer: Self-pay | Admitting: Cardiology

## 2024-05-21 ENCOUNTER — Telehealth: Payer: Self-pay | Admitting: Family

## 2024-05-21 NOTE — Telephone Encounter (Signed)
 I've never met her (she has visit in September with me) so difficult to provide recommendations. She does see Dr. Rolan in [facility] tomorrow. Next steps, including hospice if appropriate, can be discussed at that time.   Regina Bell, will you please call her to make sure she is aware of that visit tomorrow?  Dr. Rolan - CCing you as an FYI for visit tomorrow.   Regina Bell S Ezra Marquess, NP

## 2024-05-21 NOTE — Telephone Encounter (Signed)
 Called to confirm/remind patient of their appointment at the Advanced [facility] Clinic on 05/21/2024.   Appointment:   [x] Confirmed  [] Left mess   [] No answer/No voice mail  [] VM Full/unable to leave message  [] Phone not in service  Patient reminded to bring all medications and/or complete list.  Confirmed patient has transportation. Gave directions, instructed to utilize valet parking.

## 2024-05-21 NOTE — Telephone Encounter (Signed)
 S/w pt's daughter, pt has upcoming appt tomorrow with Dr. Vergil. Daughter is quite upset and cannot get pt out of house for appt.Has been declining for the last two weeks.  Pt does not have a PCP and would like a referral to hospice. Legally this pt has never been seen in this office and will have to reach out to CHF. Will send message to CHF to reach out to daughter this am.

## 2024-05-21 NOTE — Telephone Encounter (Signed)
 She was supposed to come in for cath in 7/25 but she canceled.  Have not seen since last appointment, not sure what is going on.

## 2024-05-21 NOTE — Telephone Encounter (Signed)
 Pt daughter called in stating pt is not doing well, she called to ask Reche, NP if she has recommendations for hospice care. Please advise.

## 2024-05-22 ENCOUNTER — Ambulatory Visit (HOSPITAL_COMMUNITY): Admission: RE | Admit: 2024-05-22 | Source: Ambulatory Visit | Admitting: Cardiology

## 2024-05-22 NOTE — Telephone Encounter (Signed)
 done

## 2024-05-23 ENCOUNTER — Ambulatory Visit (HOSPITAL_COMMUNITY)
Admission: RE | Admit: 2024-05-23 | Discharge: 2024-05-23 | Disposition: A | Discharge status: Home / Self Care | Source: Ambulatory Visit | Attending: Cardiology | Admitting: Cardiology

## 2024-05-23 ENCOUNTER — Other Ambulatory Visit (HOSPITAL_BASED_OUTPATIENT_CLINIC_OR_DEPARTMENT_OTHER): Payer: Self-pay

## 2024-05-23 DIAGNOSIS — G629 Polyneuropathy, unspecified: Secondary | ICD-10-CM | POA: Insufficient documentation

## 2024-05-23 DIAGNOSIS — R5383 Other fatigue: Secondary | ICD-10-CM | POA: Insufficient documentation

## 2024-05-23 DIAGNOSIS — I429 Cardiomyopathy, unspecified: Secondary | ICD-10-CM | POA: Insufficient documentation

## 2024-05-23 DIAGNOSIS — I340 Nonrheumatic mitral (valve) insufficiency: Secondary | ICD-10-CM | POA: Diagnosis not present

## 2024-05-23 DIAGNOSIS — F32A Depression, unspecified: Secondary | ICD-10-CM | POA: Insufficient documentation

## 2024-05-23 DIAGNOSIS — Z86711 Personal history of pulmonary embolism: Secondary | ICD-10-CM | POA: Insufficient documentation

## 2024-05-23 DIAGNOSIS — Z79899 Other long term (current) drug therapy: Secondary | ICD-10-CM | POA: Insufficient documentation

## 2024-05-23 DIAGNOSIS — I493 Ventricular premature depolarization: Secondary | ICD-10-CM | POA: Insufficient documentation

## 2024-05-23 DIAGNOSIS — I5020 Unspecified systolic (congestive) heart failure: Secondary | ICD-10-CM

## 2024-05-23 DIAGNOSIS — E785 Hyperlipidemia, unspecified: Secondary | ICD-10-CM | POA: Diagnosis not present

## 2024-05-23 DIAGNOSIS — R202 Paresthesia of skin: Secondary | ICD-10-CM | POA: Insufficient documentation

## 2024-05-23 DIAGNOSIS — Z7984 Long term (current) use of oral hypoglycemic drugs: Secondary | ICD-10-CM | POA: Insufficient documentation

## 2024-05-23 DIAGNOSIS — N183 Chronic kidney disease, stage 3 unspecified: Secondary | ICD-10-CM | POA: Insufficient documentation

## 2024-05-23 DIAGNOSIS — I5022 Chronic systolic (congestive) heart failure: Secondary | ICD-10-CM | POA: Insufficient documentation

## 2024-05-23 MED ORDER — SERTRALINE HCL 25 MG PO TABS
25.0000 mg | ORAL_TABLET | Freq: Every day | ORAL | 3 refills | Status: DC
  Filled 2024-05-23: qty 30, 30d supply, fill #0

## 2024-05-23 NOTE — Patient Instructions (Addendum)
 START Zoloft  25 mg daily  You have been referred to [facility] at [facility], they will call you to schedule  You have been referred to [facility] for nursing and physical therapy, they will call you to schedule  Your physician recommends that you schedule a follow-up appointment in: 2-3 weeks

## 2024-05-25 NOTE — Progress Notes (Signed)
 Heart Failure TeleHealth Note  Due to inability to get to the office physically today, Audio/video telehealth visit is felt to be most appropriate for this patient at this time.  See MyChart message from today for patient consent regarding telehealth for [name].  Date:  05/25/2024   ID:  Regina Bell, DOB 1941-05-03, MRN [id number]  Location: Home  Provider location: [place] [facility] Type of Visit: Established patient  PCP:  Cicero Aureliano SAUNDERS, MD  HF Cardiologist:  Dr. Rolan  Chief Complaint: Weakness   History of Present Illness: Regina Bell is a 83 y.o. female with a history of CHF.  She presents via Web designer for a telehealth visit today.   Her son is in the home with her, and her daughter is actually in the office today with me.  She was supposed to have an in-person visit in the office today but decided she was unable to come to the office so we did this visit via video.   She has a history of PE in 2018, CKD stage 3, SVT, and chronic systolic CHF was referred from [facility] clinic to HF physician's clinic for evaluation of CHF.  Patient had a non-triggered PE in 2018; she is no longer on anticoagulation and has not had a recurrence. In 5/25, she was admitted with progressive exertional dyspnea over about a week.  No chest pain.  Echo showed EF 20-25%, moderate LVH, moderate RV dysfunction, mild-moderate MR.  Troponin was mildly elevated but there was no trend. 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 Perfusion defect on stress images in the basal inferior wall (fixed, suggestive of prior MI). She was diuresed and felt better.  While in the hospital, she had SVT terminated by adenosine .    I set her up for a cardiac catheterization in 7/25 but she canceled the  appointment.  She had an appointment with me earlier this week but canceled the appointment.  Today, she had agreed to come to the office with her daughter but decided that she could not make it to the office, so just her daughter came and we did a video appointment.  She has not left her house for about a month.  Prior to that, she was working as a Scientist, physiological at [employer]. 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  She apparently spends the majority of the time in a recliner and her family gets food to her. She says she's worried that she'll fall if she walks.  She does get to the bathroom.  Her children think that she gets short of breath/fatigued when she tries to walk but she denies this today.  She denies chest pain. This is a significant change from her how she was doing at last appointment with me in June.  She does not have a PCP.  She is not sure which of her medications that she is taking though she says that she is taking some of them.  Her children are not sure either as they are in a pillbox.  She is likely not taking her meds as ordered.    Labs (5/25): K 3.9, creatinine 2.02, LDL 268, HS-TnI 288 => 286 Labs (6/25): K 4.4, creatinine 1.55, BNP 1471   PMH: 1. CKD stage 3 2.  PE: 2018, not triggered.  She is no longer anticoagulated.  3. Hyperlipidemia 4. H/o SVT: Zio monitor 6/25 with rare PVCs.  5. Chronic systolic CHF:  Echo in 5/25 with EF 20-25%, moderate LVH, moderate RV dysfunction, mild-moderate MR.  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 Perfusion defect on stress images in the basal inferior wall (fixed, suggestive of prior MI).    FH: No history of cardiomyopathy or CAD that she knows of.  Has h/o HTN.    SH: Widowed, lives alone in [place] though  children are near. No ETOH.  Remote smoking.  Has worked as Scientist, physiological at [employer].   ROS: All systems reviewed and negative except as per HPI.   Current Outpatient Medications  Medication Sig Dispense Refill   empagliflozin  (JARDIANCE ) 10 MG TABS tablet Take 1 tablet (10 mg total) by mouth daily before breakfast. 30 tablet 5   sacubitril -valsartan  (ENTRESTO ) 24-26 MG Take 1 tablet by mouth 2 (two) times daily. 60 tablet 11   sertraline  (ZOLOFT ) 25 MG tablet Take 1 tablet (25 mg total) by mouth daily. 30 tablet 3   aspirin  EC 81 MG tablet Take 1 tablet (81 mg total) by mouth daily. Swallow whole. 90 tablet 3   furosemide  (LASIX ) 40 MG tablet Take 1 tablet (40 mg total) by mouth daily as needed (for weight gain of 3 pounds). 30 tablet 0   metoprolol  succinate (TOPROL -XL) 25 MG 24 hr tablet Take 0.5 tablets (12.5 mg total) by mouth daily. 30 tablet 0   OVER THE COUNTER MEDICATION Take 2 tablets by mouth every morning. *otc called blood flow*     rosuvastatin  (CRESTOR ) 40 MG tablet Take 1 tablet (40 mg total) by mouth daily. 90 tablet 3   No current facility-administered medications for this encounter.    Allergies:   Patient has no known allergies.   Exam:  (Video/Tele Health Call; Exam is subjective and or/visual.) General:  Speaks in full sentences. No resp difficulty. Lungs: Normal respiratory effort with conversation.  Abdomen: Non-distended per patient report Extremities: Pt denies edema. Neuro: Alert & oriented x 3.   Recent Labs: 02/19/2024: Pro Brain Natriuretic Peptide >35,000.0 02/21/2024: Hemoglobin 13.3; Platelets 202 02/22/2024: ALT 278 02/23/2024: Magnesium  2.1 03/17/2024: B Natriuretic Peptide 1,471.2 03/24/2024: BUN 25; Creatinine, Ser 1.55; Potassium 4.4; Sodium 140  Personally reviewed   Wt Readings from Last 3 Encounters:  03/17/24 81.1 kg (178 lb 12.8 oz)  02/29/24 78.4 kg (172 lb 12.8 oz)  02/22/24 81.7 kg (180 lb 1.9 oz)      ASSESSMENT AND PLAN:  1.  Immobility/bed-bound/failure to thrive: I am uncertain of why she is currently unable to leave the house and only able to walk short distances. She says it is due to neuropathy (pain in feet) and fear of falling, but she did not seem to be experiencing this when I saw her in 6/25.  She denies chest pain or dyspnea though her family thinks that she tires out quickly when she tries to walk.  She is not very interactive during the video visit today, most of the history comes from her son and daughter.  I wonder if depression is not playing a significant role here.  Her family is concerned about this.   - I really need to see her in the office to assess her volume status, etc, with her  CHF.  We will reschedule an appointment and family will get a wheelchair to try to bring her in.  - I will try to get home health/PT in to see her.  - Her daughter asks about hospice care.  I am not sure that this is appropriate at this time as I am unsure as to the cause of her current symptoms.  - She needs a PCP, I will refer to the [facility] office that is near her home.  - Start low dose of sertraline  25 mg daily for depression.  2. Chronic systolic CHF: Cardiomyopathy of uncertain etiology.  Echo in 5/25 showed EF 20-25%, moderate LVH, moderate RV dysfunction, mild-moderate MR.  Stress cardiac MRI in 5/25 showed LVEF 21%, RVEF 31%, subendocardial LGE (>50% wall thickness) basal inferior wall suggestive of prior MI, basal septal mid-wall LGE (nonspecific, can see with dilated cardiomyopathy). Perfusion defect on stress images in the basal inferior wall (fixed, suggestive of prior MI).  No pre-existing chest pain, troponin mildly elevated with no trend when admitted in 5/25.  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  Situation complicated by apparent CKD stage 3. NYHA class hard to discern, she says that she has been mainly lying in her recliner over the last month due to  neuropathy.  She has not bene weighing.  - She should be on Entresto  24/26 bid but not sure if she is taking this.  She will need a BMET when she comes in the office/  - Continue Jardiance  10 mg daily (not sure if taking).  - Continue Toprol  XL 12.5 daily (not sure if taking).  - She has Lasix  for prn use.   - I had arranged for LHC/RHC to assess for coronary disease and to assess filling pressures/cardiac output but she canceled.  3. CAD: Stress cardiac MRI is suggestive of prior inferior MI as above.  No chest pain.  Troponin was mildly elevated with no trend at last admission, more consistent with demand ischemia than ACS.   - Coronary angiography ordered but she canceled.  Will readdress at followup. .  - She should be on ASA 81 daily but not sure that she is taking.  - Increased Crestor  to 40 mg daily given LDL 268 in 5/25, but not sure if she is taking.  4. SVT: Patient noted to have asymptomatic SVT episode, terminated by adenosine  in 5/25. Zio monitor in 6/25 showed rare PVCs.  - Continue Toprol  XL 12.5 mg daily. 5. H/o PE: In 2018, not triggered.  No recurrence.  6. CKD stage 3: Last creatinine 1.55.  7. Hyperlipidemia: LDL markedly high.   - Needs to take increased Crestor , repeat lipids at followup.    Patient Risk: After full review of this patients clinical status, I feel that they are at moderate risk for cardiac decompensation at this time.  Relevant cardiac medications were reviewed at length with the patient today. The patient does not have concerns regarding their medications at this time.   Recommended follow-up:  Schedule appt in office, family to bring her in with wheelchair.   Today, I have spent 31 minutes with the patient with telehealth technology discussing the above issues .    Signed, Ezra Shuck, MD  05/25/2024  [facility] [address] [facility] [place] [address] [phone] (office) [phone]  (fax)

## 2024-05-27 ENCOUNTER — Other Ambulatory Visit (HOSPITAL_BASED_OUTPATIENT_CLINIC_OR_DEPARTMENT_OTHER): Payer: Self-pay

## 2024-06-05 ENCOUNTER — Inpatient Hospital Stay (HOSPITAL_COMMUNITY)
Admission: EM | Admit: 2024-06-05 | Discharge: 2024-06-09 | DRG: 683 | Disposition: A | Discharge status: Hospice | Attending: Internal Medicine | Admitting: Internal Medicine

## 2024-06-05 ENCOUNTER — Inpatient Hospital Stay (HOSPITAL_COMMUNITY)

## 2024-06-05 ENCOUNTER — Inpatient Hospital Stay (HOSPITAL_COMMUNITY)
Admission: RE | Admit: 2024-06-05 | Discharge: 2024-06-05 | Disposition: A | Discharge status: Home / Self Care | Source: Ambulatory Visit | Attending: Cardiology | Admitting: Cardiology

## 2024-06-05 ENCOUNTER — Emergency Department (HOSPITAL_COMMUNITY)

## 2024-06-05 ENCOUNTER — Other Ambulatory Visit: Payer: Self-pay

## 2024-06-05 DIAGNOSIS — E861 Hypovolemia: Secondary | ICD-10-CM | POA: Diagnosis present

## 2024-06-05 DIAGNOSIS — N200 Calculus of kidney: Secondary | ICD-10-CM | POA: Diagnosis not present

## 2024-06-05 DIAGNOSIS — I10 Essential (primary) hypertension: Secondary | ICD-10-CM | POA: Diagnosis not present

## 2024-06-05 DIAGNOSIS — E872 Acidosis, unspecified: Secondary | ICD-10-CM | POA: Diagnosis present

## 2024-06-05 DIAGNOSIS — R627 Adult failure to thrive: Secondary | ICD-10-CM | POA: Diagnosis present

## 2024-06-05 DIAGNOSIS — Z743 Need for continuous supervision: Secondary | ICD-10-CM | POA: Diagnosis not present

## 2024-06-05 DIAGNOSIS — Z79899 Other long term (current) drug therapy: Secondary | ICD-10-CM

## 2024-06-05 DIAGNOSIS — R4589 Other symptoms and signs involving emotional state: Secondary | ICD-10-CM | POA: Diagnosis not present

## 2024-06-05 DIAGNOSIS — N183 Chronic kidney disease, stage 3 unspecified: Secondary | ICD-10-CM | POA: Diagnosis not present

## 2024-06-05 DIAGNOSIS — Z7401 Bed confinement status: Secondary | ICD-10-CM

## 2024-06-05 DIAGNOSIS — M79671 Pain in right foot: Secondary | ICD-10-CM

## 2024-06-05 DIAGNOSIS — I5020 Unspecified systolic (congestive) heart failure: Secondary | ICD-10-CM | POA: Diagnosis not present

## 2024-06-05 DIAGNOSIS — E1142 Type 2 diabetes mellitus with diabetic polyneuropathy: Secondary | ICD-10-CM | POA: Diagnosis present

## 2024-06-05 DIAGNOSIS — I2510 Atherosclerotic heart disease of native coronary artery without angina pectoris: Secondary | ICD-10-CM | POA: Diagnosis present

## 2024-06-05 DIAGNOSIS — E871 Hypo-osmolality and hyponatremia: Secondary | ICD-10-CM | POA: Diagnosis present

## 2024-06-05 DIAGNOSIS — N179 Acute kidney failure, unspecified: Secondary | ICD-10-CM | POA: Diagnosis not present

## 2024-06-05 DIAGNOSIS — N1831 Chronic kidney disease, stage 3a: Secondary | ICD-10-CM | POA: Diagnosis not present

## 2024-06-05 DIAGNOSIS — Z87891 Personal history of nicotine dependence: Secondary | ICD-10-CM | POA: Diagnosis not present

## 2024-06-05 DIAGNOSIS — N3080 Other cystitis without hematuria: Secondary | ICD-10-CM | POA: Diagnosis not present

## 2024-06-05 DIAGNOSIS — E1122 Type 2 diabetes mellitus with diabetic chronic kidney disease: Secondary | ICD-10-CM

## 2024-06-05 DIAGNOSIS — Z993 Dependence on wheelchair: Secondary | ICD-10-CM | POA: Diagnosis not present

## 2024-06-05 DIAGNOSIS — I5022 Chronic systolic (congestive) heart failure: Secondary | ICD-10-CM | POA: Diagnosis not present

## 2024-06-05 DIAGNOSIS — K5730 Diverticulosis of large intestine without perforation or abscess without bleeding: Secondary | ICD-10-CM | POA: Diagnosis not present

## 2024-06-05 DIAGNOSIS — Z86711 Personal history of pulmonary embolism: Secondary | ICD-10-CM

## 2024-06-05 DIAGNOSIS — Z66 Do not resuscitate: Secondary | ICD-10-CM | POA: Diagnosis not present

## 2024-06-05 DIAGNOSIS — I130 Hypertensive heart and chronic kidney disease with heart failure and stage 1 through stage 4 chronic kidney disease, or unspecified chronic kidney disease: Secondary | ICD-10-CM | POA: Diagnosis present

## 2024-06-05 DIAGNOSIS — K449 Diaphragmatic hernia without obstruction or gangrene: Secondary | ICD-10-CM | POA: Diagnosis not present

## 2024-06-05 DIAGNOSIS — E785 Hyperlipidemia, unspecified: Secondary | ICD-10-CM | POA: Diagnosis present

## 2024-06-05 DIAGNOSIS — I509 Heart failure, unspecified: Secondary | ICD-10-CM | POA: Diagnosis not present

## 2024-06-05 DIAGNOSIS — D649 Anemia, unspecified: Secondary | ICD-10-CM | POA: Diagnosis not present

## 2024-06-05 DIAGNOSIS — L89152 Pressure ulcer of sacral region, stage 2: Secondary | ICD-10-CM | POA: Diagnosis not present

## 2024-06-05 DIAGNOSIS — Z7982 Long term (current) use of aspirin: Secondary | ICD-10-CM

## 2024-06-05 DIAGNOSIS — Z515 Encounter for palliative care: Secondary | ICD-10-CM | POA: Diagnosis not present

## 2024-06-05 DIAGNOSIS — R531 Weakness: Secondary | ICD-10-CM | POA: Diagnosis not present

## 2024-06-05 DIAGNOSIS — Z7984 Long term (current) use of oral hypoglycemic drugs: Secondary | ICD-10-CM

## 2024-06-05 DIAGNOSIS — R7989 Other specified abnormal findings of blood chemistry: Secondary | ICD-10-CM | POA: Diagnosis not present

## 2024-06-05 DIAGNOSIS — N189 Chronic kidney disease, unspecified: Secondary | ICD-10-CM | POA: Diagnosis not present

## 2024-06-05 DIAGNOSIS — R0602 Shortness of breath: Secondary | ICD-10-CM | POA: Diagnosis not present

## 2024-06-05 DIAGNOSIS — I2699 Other pulmonary embolism without acute cor pulmonale: Secondary | ICD-10-CM | POA: Diagnosis not present

## 2024-06-05 DIAGNOSIS — Z7189 Other specified counseling: Secondary | ICD-10-CM | POA: Diagnosis not present

## 2024-06-05 DIAGNOSIS — Z789 Other specified health status: Secondary | ICD-10-CM | POA: Diagnosis not present

## 2024-06-05 DIAGNOSIS — N184 Chronic kidney disease, stage 4 (severe): Secondary | ICD-10-CM | POA: Diagnosis present

## 2024-06-05 DIAGNOSIS — N136 Pyonephrosis: Secondary | ICD-10-CM | POA: Diagnosis present

## 2024-06-05 DIAGNOSIS — I429 Cardiomyopathy, unspecified: Secondary | ICD-10-CM | POA: Diagnosis present

## 2024-06-05 DIAGNOSIS — I471 Supraventricular tachycardia, unspecified: Secondary | ICD-10-CM | POA: Diagnosis present

## 2024-06-05 DIAGNOSIS — L89313 Pressure ulcer of right buttock, stage 3: Secondary | ICD-10-CM | POA: Diagnosis present

## 2024-06-05 DIAGNOSIS — M79672 Pain in left foot: Secondary | ICD-10-CM | POA: Diagnosis not present

## 2024-06-05 DIAGNOSIS — I5042 Chronic combined systolic (congestive) and diastolic (congestive) heart failure: Secondary | ICD-10-CM | POA: Diagnosis not present

## 2024-06-05 DIAGNOSIS — T465X5A Adverse effect of other antihypertensive drugs, initial encounter: Secondary | ICD-10-CM | POA: Diagnosis present

## 2024-06-05 DIAGNOSIS — I5021 Acute systolic (congestive) heart failure: Secondary | ICD-10-CM | POA: Diagnosis not present

## 2024-06-05 DIAGNOSIS — L89322 Pressure ulcer of left buttock, stage 2: Secondary | ICD-10-CM | POA: Diagnosis present

## 2024-06-05 DIAGNOSIS — I129 Hypertensive chronic kidney disease with stage 1 through stage 4 chronic kidney disease, or unspecified chronic kidney disease: Secondary | ICD-10-CM | POA: Diagnosis not present

## 2024-06-05 DIAGNOSIS — I447 Left bundle-branch block, unspecified: Secondary | ICD-10-CM | POA: Diagnosis present

## 2024-06-05 DIAGNOSIS — N132 Hydronephrosis with renal and ureteral calculous obstruction: Secondary | ICD-10-CM | POA: Diagnosis not present

## 2024-06-05 DIAGNOSIS — M79673 Pain in unspecified foot: Secondary | ICD-10-CM | POA: Diagnosis not present

## 2024-06-05 DIAGNOSIS — R54 Age-related physical debility: Secondary | ICD-10-CM | POA: Diagnosis present

## 2024-06-05 LAB — CBC WITH DIFFERENTIAL/PLATELET
Abs Immature Granulocytes: 0.1 K/uL — ABNORMAL HIGH (ref 0.00–0.07)
Basophils Absolute: 0 K/uL (ref 0.0–0.1)
Basophils Relative: 0 %
Eosinophils Absolute: 0 K/uL (ref 0.0–0.5)
Eosinophils Relative: 0 %
HCT: 38.2 % (ref 36.0–46.0)
Hemoglobin: 12.2 g/dL (ref 12.0–15.0)
Immature Granulocytes: 1 %
Lymphocytes Relative: 3 %
Lymphs Abs: 0.4 K/uL — ABNORMAL LOW (ref 0.7–4.0)
MCH: 29.4 pg (ref 26.0–34.0)
MCHC: 31.9 g/dL (ref 30.0–36.0)
MCV: 92 fL (ref 80.0–100.0)
Monocytes Absolute: 0.6 K/uL (ref 0.1–1.0)
Monocytes Relative: 5 %
Neutro Abs: 11.9 K/uL — ABNORMAL HIGH (ref 1.7–7.7)
Neutrophils Relative %: 91 %
Platelets: 232 K/uL (ref 150–400)
RBC: 4.15 MIL/uL (ref 3.87–5.11)
RDW: 14.6 % (ref 11.5–15.5)
WBC: 13 K/uL — ABNORMAL HIGH (ref 4.0–10.5)
nRBC: 0 % (ref 0.0–0.2)

## 2024-06-05 LAB — COMPREHENSIVE METABOLIC PANEL WITH GFR
ALT: 16 U/L (ref 0–44)
AST: 19 U/L (ref 15–41)
Albumin: 3.6 g/dL (ref 3.5–5.0)
Alkaline Phosphatase: 58 U/L (ref 38–126)
Anion gap: 29 — ABNORMAL HIGH (ref 5–15)
BUN: 159 mg/dL — ABNORMAL HIGH (ref 8–23)
CO2: 13 mmol/L — ABNORMAL LOW (ref 22–32)
Calcium: 9.9 mg/dL (ref 8.9–10.3)
Chloride: 91 mmol/L — ABNORMAL LOW (ref 98–111)
Creatinine, Ser: 13.4 mg/dL — ABNORMAL HIGH (ref 0.44–1.00)
GFR, Estimated: 2 mL/min — ABNORMAL LOW (ref >60)
Glucose, Bld: 134 mg/dL — ABNORMAL HIGH (ref 70–99)
Potassium: 5 mmol/L (ref 3.5–5.1)
Sodium: 132 mmol/L — ABNORMAL LOW (ref 135–145)
Total Bilirubin: 0.7 mg/dL (ref 0.0–1.2)
Total Protein: 7.7 g/dL (ref 6.5–8.1)

## 2024-06-05 LAB — TROPONIN T, HIGH SENSITIVITY
Troponin T High Sensitivity: 184 ng/L (ref 0–19)
Troponin T High Sensitivity: 193 ng/L (ref 0–19)

## 2024-06-05 LAB — PROTIME-INR
INR: 1.1 (ref 0.8–1.2)
Prothrombin Time: 15.1 s (ref 11.4–15.2)

## 2024-06-05 LAB — LACTIC ACID, PLASMA: Lactic Acid, Venous: 1.2 mmol/L (ref 0.5–1.9)

## 2024-06-05 LAB — CK: Total CK: 25 U/L — ABNORMAL LOW (ref 38–234)

## 2024-06-05 LAB — PHOSPHORUS: Phosphorus: 9 mg/dL — ABNORMAL HIGH (ref 2.5–4.6)

## 2024-06-05 LAB — PRO BRAIN NATRIURETIC PEPTIDE: Pro Brain Natriuretic Peptide: 34718 pg/mL — ABNORMAL HIGH (ref <300.0)

## 2024-06-05 LAB — LIPASE, BLOOD: Lipase: 72 U/L — ABNORMAL HIGH (ref 11–51)

## 2024-06-05 LAB — CBG MONITORING, ED: Glucose-Capillary: 122 mg/dL — ABNORMAL HIGH (ref 70–99)

## 2024-06-05 LAB — MAGNESIUM: Magnesium: 2.6 mg/dL — ABNORMAL HIGH (ref 1.7–2.4)

## 2024-06-05 MED ORDER — SODIUM CHLORIDE 0.9 % IV BOLUS
500.0000 mL | Freq: Once | INTRAVENOUS | Status: AC
  Administered 2024-06-05: 500 mL via INTRAVENOUS

## 2024-06-05 MED ORDER — STERILE WATER FOR INJECTION IV SOLN
INTRAVENOUS | Status: AC
  Filled 2024-06-05: qty 1000
  Filled 2024-06-05: qty 150

## 2024-06-05 MED ORDER — INSULIN ASPART 100 UNIT/ML IJ SOLN
0.0000 [IU] | Freq: Three times a day (TID) | INTRAMUSCULAR | Status: DC
  Filled 2024-06-05: qty 0.06

## 2024-06-05 NOTE — ED Provider Notes (Signed)
 [place] [facility] AT [facility] Provider Note   CSN: [id number] Arrival date & time: 06/05/24  1441     Patient presents with: Foot Pain   Regina Bell is a 83 y.o. female.   83 yo F with a chief complaint of bilateral foot pain.  This been going on for about 3 weeks she thinks.  She has been unable to walk due to the discomfort.  Feels like they are both numb.  She denies any back pain denies issues moving her bowel or bladder.  She is able to move with assistance and typically has her son get her up and down to the bathroom.  She denies any chest pain or difficulty breathing.  Has not really been able to eat and drink well.  She said she had seen her cardiologist virtually and he had told her to eat is much as she could.  She says every time she eats she throws up.   Foot Pain       Prior to Admission medications   Medication Sig Start Date End Date Taking? Authorizing Provider  aspirin  EC 81 MG tablet Take 1 tablet (81 mg total) by mouth daily. Swallow whole. 03/17/24   Rolan Ezra RAMAN, MD  empagliflozin  (JARDIANCE ) 10 MG TABS tablet Take 1 tablet (10 mg total) by mouth daily before breakfast. 02/29/24   Clegg, Amy D, NP  furosemide  (LASIX ) 40 MG tablet Take 1 tablet (40 mg total) by mouth daily as needed (for weight gain of 3 pounds). 02/29/24   Clegg, Amy D, NP  metoprolol  succinate (TOPROL -XL) 25 MG 24 hr tablet Take 0.5 tablets (12.5 mg total) by mouth daily. 02/24/24   Singh, Prashant K, MD  OVER THE COUNTER MEDICATION Take 2 tablets by mouth every morning. *otc called blood flow*    [provider]  rosuvastatin  (CRESTOR ) 40 MG tablet Take 1 tablet (40 mg total) by mouth daily. 03/17/24   Rolan Ezra RAMAN, MD  sacubitril -valsartan  (ENTRESTO ) 24-26 MG Take 1 tablet by mouth 2 (two) times daily. 03/17/24   Rolan Ezra RAMAN, MD  sertraline  (ZOLOFT ) 25 MG tablet Take 1 tablet (25 mg total) by mouth daily. 05/23/24   Rolan Ezra RAMAN, MD     Allergies: Patient has no known allergies.    Review of Systems  Updated Vital Signs BP (!) 149/63 (BP Location: Right Wrist)   Pulse 74   Temp 98.2 F (36.8 C) (Oral)   Resp 15   SpO2 100%   Physical Exam Vitals and nursing note reviewed.  Constitutional:      General: She is not in acute distress.    Appearance: She is well-developed. She is not diaphoretic.  HENT:     Head: Normocephalic and atraumatic.  Eyes:     Pupils: Pupils are equal, round, and reactive to light.  Cardiovascular:     Rate and Rhythm: Normal rate and regular rhythm.     Heart sounds: No murmur heard.    No friction rub. No gallop.  Pulmonary:     Effort: Pulmonary effort is normal.     Breath sounds: No wheezing or rales.  Abdominal:     General: There is no distension.     Palpations: Abdomen is soft.     Tenderness: There is no abdominal tenderness.  Musculoskeletal:        General: No tenderness.     Cervical back: Normal range of motion and neck supple.     Comments:  Both lower extremities are cool to the touch right worse than the left.  Cap refill about 4 to 5 seconds.  No obvious palpable pulses.  Posterior tibialis dopplerable pulses.  Skin:    General: Skin is warm and dry.  Neurological:     Mental Status: She is alert and oriented to person, place, and time.  Psychiatric:        Behavior: Behavior normal.     (all labs ordered are listed, but only abnormal results are displayed) Labs Reviewed  CBC WITH DIFFERENTIAL/PLATELET - Abnormal; Notable for the following components:      Result Value   WBC 13.0 (*)    Neutro Abs 11.9 (*)    Lymphs Abs 0.4 (*)    Abs Immature Granulocytes 0.10 (*)    All other components within normal limits  COMPREHENSIVE METABOLIC PANEL WITH GFR - Abnormal; Notable for the following components:   Sodium 132 (*)    Chloride 91 (*)    CO2 13 (*)    Glucose, Bld 134 (*)    BUN 159 (*)    Creatinine, Ser 13.40 (*)    GFR, Estimated 2 (*)     Anion gap 29 (*)    All other components within normal limits  PRO BRAIN NATRIURETIC PEPTIDE - Abnormal; Notable for the following components:   Pro Brain Natriuretic Peptide 34,718.0 (*)    All other components within normal limits  LIPASE, BLOOD - Abnormal; Notable for the following components:   Lipase 72 (*)    All other components within normal limits  TROPONIN T, HIGH SENSITIVITY - Abnormal; Notable for the following components:   Troponin T High Sensitivity 184 (*)    All other components within normal limits  CK  MAGNESIUM   PHOSPHORUS  CREATININE, URINE, RANDOM  OSMOLALITY, URINE  OSMOLALITY  PREALBUMIN  SODIUM, URINE, RANDOM  PROTIME-INR  URINALYSIS, COMPLETE (UACMP) WITH MICROSCOPIC  LACTIC ACID, PLASMA  LACTIC ACID, PLASMA  TROPONIN T, HIGH SENSITIVITY    EKG: EKG Interpretation Date/Time:  Thursday June 05 2024 19:24:24 EDT Ventricular Rate:  74 PR Interval:  197 QRS Duration:  138 QT Interval:  409 QTC Calculation: 454 R Axis:   -65  Text Interpretation: Sinus rhythm Atrial premature complex Left bundle branch block No significant change since last tracing Confirmed by Emil Share [phone]) on 06/05/2024 7:31:26 PM  Radiology: [facility] Chest 2 View Result Date: 06/05/2024 CLINICAL DATA:  Shortness of breath. EXAM: CHEST - 2 VIEW COMPARISON:  Feb 20, 2024. FINDINGS: Stable cardiomediastinal silhouette. Both lungs are clear. The visualized skeletal structures are unremarkable. IMPRESSION: No active cardiopulmonary disease. Electronically Signed   By: Lynwood Landy Raddle M.D.   On: 06/05/2024 17:00     .Critical Care  Performed by: Emil Share, DO Authorized by: Emil Share, DO   Critical care provider statement:    Critical care time (minutes):  35   Critical care time was exclusive of:  Separately billable procedures and treating other patients   Critical care was time spent personally by me on the following activities:  Development of treatment plan with patient or  surrogate, discussions with consultants, evaluation of patient's response to treatment, examination of patient, ordering and review of laboratory studies, ordering and review of radiographic studies, ordering and performing treatments and interventions, pulse oximetry, re-evaluation of patient's condition and review of old charts   Care discussed with: admitting provider      Medications Ordered in the ED - No data to display  Medical Decision Making Amount and/or Complexity of Data Reviewed Labs: ordered. Radiology: ordered. ECG/medicine tests: ordered.  Risk Decision regarding hospitalization.   83 yo F with a chief complaints of bilateral foot pain.  She tells me she think she has neuropathy.  Tells me is been going on for about 3 weeks.  I am a bit worried the patient is suffering from claudication.  She denies any pain just tells me that the feet are numb.  She has been restricted to the house due to her inability to walk for the past 3 weeks.  She had a recent virtual visit with her heart failure physician.  He had sent off for home health since that it sounds like the patient has not been able to eat and drink well.  Will obtain a laboratory evaluation.  Chest x-ray.  Leukocytosis.  Acute renal failure.  BUN 160.  No hyperkalemia. Troponin elevated.  Less than the last check in May.  BNP significantly elevated.  Chest x-ray independently interpreted by me without obvious edema.  I discussed case with Dr. Jerrye, nephrology who recommends admission to [facility].  Recommends Foley catheter placement and will need CT abdomen pelvis.  I discussed results with patient and family.  The patients results and plan were reviewed and discussed.   Any x-rays performed were independently reviewed by myself.   Differential diagnosis were considered with the presenting HPI.  Medications - No data to display  Vitals:   06/05/24 1600 06/05/24 1928  BP: 101/64  (!) 149/63  Pulse: 79 74  Resp: 17 15  Temp: (!) 97.5 F (36.4 C) 98.2 F (36.8 C)  TempSrc: Oral Oral  SpO2: 100% 100%    Final diagnoses:  AKI (acute kidney injury) (HCC)  Troponin level elevated  Foot pain, bilateral    Admission/ observation were discussed with the admitting physician, patient and/or family and they are comfortable with the plan.          Final diagnoses:  AKI (acute kidney injury) (HCC)  Troponin level elevated  Foot pain, bilateral    ED Discharge Orders     None          Emil Share, DO 06/05/24 2012

## 2024-06-05 NOTE — Subjective & Objective (Signed)
 Patient coming from home complaining of bilateral foot pain for the past 3 weeks staying is too weak to support her weight complaining of numbness and tingling no nausea vomiting diarrhea Blood pressure 118/68 heart rate of 100 And is significant enough to where she has not been able to walk Feels like both feet are numb No back pain no bowel bladder incontinence No chest pain or shortness of breath Decreased p.o. intake has been having a lot of vomiting when she tries to eat In emergency department labs worrisome for leukocytosis and AKI BUN up to 116 No hyperkalemia Nephrology consulted Dr. Jerrye who recommended transfer to [facility] Foley catheter and obtain CT abdomen

## 2024-06-05 NOTE — ED Notes (Signed)
 Report given to Beverley, RN [facility].

## 2024-06-05 NOTE — Assessment & Plan Note (Addendum)
 Appreciate nephrology consult obtain urinary electrolytes  CT abdomen Place Foley Strict I's and O's May benefit from gentle rehydration Overall very poor prognosis patient and family declined dialysis Would benefit from palliative care consult

## 2024-06-05 NOTE — Consult Note (Signed)
 [facility] Renal Consultation Note  Requesting MD: Rolan Quale, DO Indication for Consultation:  AKI   Chief complaint: foot numbness  HPI:  Regina Bell is a 83 y.o. 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  She is interviewed in the ER with her two children at bedside.  She has had difficulty supporting her weight and she hasn't really been able to walk.  She was working as a Scientist, physiological up until five weeks ago and has had a decline in her functional status.  She has not been eating well and this has been progressively getting worse.  She has had nausea with occasional vomiting and occasional diarrhea.  She denies any NSAID use.  She hasn't needed to use her lasix  - it's just as needed.  Note that her heart failure was attributed to a prior suspected MI.  She states that if she had a heart attack, she didn't feel it.  She has seen Dr. Rolan in CHF clinic.  She has been on lasix  as needed as well as jardiance  and entresto .  She had AKI in May with Cr peak 2.0.  Baseline Cr has been 1.6 - 1.7 since the events of this May.  On chart review, her daughter called in to the cardiology office earlier in August stating that the patient was not doing well and asked for recommendations for hospice care at that time.  We requested a foley and CT a/p.  Foley was just placed without immediate urine output.   She was noted to have AKI with Cr 13.4.  Nephrology is consulted for assistance with management of acute kidney injury.  The patient states that she wouldn't want to do anything like dialysis and her children are supportive of this decision. She is ok with a treat the treatable approach aside from dialysis standpoint.  She has a bed at [facility] per hospitalist team.       Creatinine, Ser  Date/Time Value Ref Range  Status  06/05/2024 05:47 PM 13.40 (H) 0.44 - 1.00 mg/dL Final  93/83/7974 97:87 PM 1.55 (H) 0.44 - 1.00 mg/dL Final  93/90/7974 96:53 PM 1.57 (H) 0.44 - 1.00 mg/dL Final  94/76/7974 87:72 PM 2.02 (H) 0.44 - 1.00 mg/dL Final  94/82/7974 96:45 AM 1.50 (H) 0.44 - 1.00 mg/dL Final  94/83/7974 95:97 AM 1.65 (H) 0.44 - 1.00 mg/dL Final  94/84/7974 95:64 AM 1.73 (H) 0.44 - 1.00 mg/dL Final  94/85/7974 95:71 AM 1.68 (H) 0.44 - 1.00 mg/dL Final  94/86/7974 90:66 AM 1.68 (H) 0.44 - 1.00 mg/dL Final  95/78/7981 97:40 AM 0.92 0.44 - 1.00 mg/dL Final  95/79/7981 89:83 AM 1.07 (H) 0.44 - 1.00 mg/dL Final     PMHx: CAD PE Combined systolic and diastolic CHF  Past Surgical History:  Procedure Laterality Date   IR THORACENTESIS ASP PLEURAL SPACE W/IMG GUIDE  02/20/2024    Family Hx: no family history of cardiac disease  Social History:  reports that she has quit smoking. She has never used smokeless tobacco. She reports that she does not drink alcohol and does not use drugs.  Allergies: No Known Allergies  Medications: Prior to Admission medications   Medication Sig Start Date End Date Taking? Authorizing Provider  aspirin  EC 81 MG tablet Take 1 tablet (81 mg total) by mouth daily. Swallow whole. 03/17/24  Rolan Ezra RAMAN, MD  empagliflozin  (JARDIANCE ) 10 MG TABS tablet Take 1 tablet (10 mg total) by mouth daily before breakfast. 02/29/24   Clegg, Amy D, NP  furosemide  (LASIX ) 40 MG tablet Take 1 tablet (40 mg total) by mouth daily as needed (for weight gain of 3 pounds). 02/29/24   Clegg, Amy D, NP  metoprolol  succinate (TOPROL -XL) 25 MG 24 hr tablet Take 0.5 tablets (12.5 mg total) by mouth daily. 02/24/24   Singh, Prashant K, MD  OVER THE COUNTER MEDICATION Take 2 tablets by mouth every morning. *otc called blood flow*    [provider]  rosuvastatin  (CRESTOR ) 40 MG tablet Take 1 tablet (40 mg total) by mouth daily. 03/17/24   Rolan Ezra RAMAN, MD  sacubitril -valsartan  (ENTRESTO ) 24-26  MG Take 1 tablet by mouth 2 (two) times daily. 03/17/24   Rolan Ezra RAMAN, MD  sertraline  (ZOLOFT ) 25 MG tablet Take 1 tablet (25 mg total) by mouth daily. 05/23/24   Rolan Ezra RAMAN, MD    I have reviewed the patient's current and reported prior to admission medications.  Labs:     Latest Ref Rng & Units 06/05/2024    5:47 PM 03/24/2024    2:12 PM 03/17/2024    3:46 PM  BMP  Glucose 70 - 99 mg/dL 865  893  98   BUN 8 - 23 mg/dL 840  25  25   Creatinine 0.44 - 1.00 mg/dL 86.59  8.44  8.42   Sodium 135 - 145 mmol/L 132  140  139   Potassium 3.5 - 5.1 mmol/L 5.0  4.4  4.8   Chloride 98 - 111 mmol/L 91  106  105   CO2 22 - 32 mmol/L 13  25  26    Calcium  8.9 - 10.3 mg/dL 9.9  9.4  9.3     Urinalysis    Component Value Date/Time   COLORURINE YELLOW 02/19/2024 0933   APPEARANCEUR HAZY (A) 02/19/2024 0933   LABSPEC 1.008 02/19/2024 0933   PHURINE 7.5 02/19/2024 0933   GLUCOSEU NEGATIVE 02/19/2024 0933   HGBUR NEGATIVE 02/19/2024 0933   BILIRUBINUR NEGATIVE 02/19/2024 0933   KETONESUR NEGATIVE 02/19/2024 0933   PROTEINUR NEGATIVE 02/19/2024 0933   NITRITE POSITIVE (A) 02/19/2024 0933   LEUKOCYTESUR LARGE (A) 02/19/2024 0933     ROS:  Pertinent items noted in HPI and remainder of comprehensive ROS otherwise negative.  Physical Exam: Vitals:   06/05/24 1600 06/05/24 1928  BP: 101/64 (!) 149/63  Pulse: 79 74  Resp: 17 15  Temp: (!) 97.5 F (36.4 C) 98.2 F (36.8 C)  SpO2: 100% 100%     General: elderly female in bed in NAD - appears younger than stated age.  Head of bed at 30 degrees HEENT: NCAT  Eyes: EOMI sclera anicteric Neck: supple trachea midline  Heart: S1S2 no rub Lungs: clear and unlabored on room air  Abdomen: soft/nt/nd Extremities: no edema appreciated  Skin: poor skin turgor  Psych normal mood and affect Neuro: alert and oriented x 3 (delay for year but is correct); provides history and follows commands  Assessment/Plan:   # AKI  - Secondary to  prolonged pre-renal insults in the setting of optimizing her heart failure and a recent decline in her health and functional status.  Then compounded by n/v and poor intake - Hold diuretics  - Hold jardiance  and entresto  - Gentle fluids as below.  NS once now then transition to bicarb gtt at 75 ml/hr x 24 hours.  Will need to reassess fluids tomorrow  - Please place foley  - I have ordered strict ins/outs and daily weights  - Agree with checking CK   - UA and up/cr ratio.  (Note there has been an error with urine protein/cr ratios here being calculated incorrectly recently - lab is aware and working on this) - She does not want to pursue dialysis should her renal function worsen or fail to improve.  We discussed that at that time if appropriate we would recommend a transition to a more comfort-based focus.  At this time, we are treating the treatable to see if we can improve her renal function  # Chronic combined systolic and diastolic CHF  - Diuretics on hold and mindful of CHF/respiratory status  - Gentle fluids as above - I have ordered strict ins/outs and daily weights   # HTN  - Acceptable on current regimen - As above, entresto  on hold    # Metabolic acidosis - Setting of AKI  - Will start bicarb gtt     Thank you for the consult.  Please do not hesitate to contact me with any questions regarding our patient   Katheryn JAYSON Saba 06/05/2024, 9:06 PM

## 2024-06-05 NOTE — Assessment & Plan Note (Signed)
 Avoid over aggressive fluid resuscitation given severe combined systolic diastolic CHF.  Probably would benefit from cardiology consult to help manage this complex patient

## 2024-06-05 NOTE — Assessment & Plan Note (Signed)
 Close to baseline will continue to monitor in the setting of AKI Not endorsing any chest pain continue to cycle

## 2024-06-05 NOTE — ED Notes (Signed)
[facility] called for pt transport  

## 2024-06-05 NOTE — H&P (Signed)
 Regina Bell DOB: 1941/05/20 DOA: 06/05/2024     PCP: Cicero Aureliano SAUNDERS, MD   Outpatient Specialists:  CARDS:   Dr. Annabella Scarce, MD    Patient arrived to ER on 06/05/24 at 1441 Referred by Attending Emil Share, DO   Patient coming from:    home Lives  With family     Chief Complaint:   Chief Complaint  Patient presents with   Foot Pain    HPI: Regina Bell is a 83 y.o. 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 intake has been having a lot of vomiting when she tries to eat In emergency department labs worrisome for leukocytosis and AKI BUN up to 116 No hyperkalemia Nephrology consulted Dr. Jerrye who recommended transfer to [facility] Foley catheter and obtain CT abdomen    Cr up to 13  Denies significant ETOH intake   Does not smoke      Regarding pertinent Chronic problems:     Hyperlipidemia -  on statins  Crestor  Lipid Panel     Component Value Date/Time   CHOL 336 (H) 02/23/2024 0354   TRIG 113 02/23/2024 0354   HDL 45 02/23/2024 0354   CHOLHDL 7.5 02/23/2024 0354   VLDL 23 02/23/2024 0354   LDLCALC 268 (H) 02/23/2024 0354     HTN on Toprol , Cozaar    chronic CHF diastolic/systolic/ combined - lasix  last echo  Recent Results (from the past 56199 hours)  ECHOCARDIOGRAM COMPLETE   Collection Time: 02/20/24  2:23 PM  Result Value   Weight 3,012.37   BP 146/62   S' Lateral 4.90   AR max vel  2.06   AV Area VTI 2.51   AV Mean grad 2.0   AV Peak grad 4.2   Ao pk vel 1.02   Area-P 1/2 3.87   AV Area mean vel 2.24   MV VTI 1.30   Est EF 20 - 25%   Narrative      ECHOCARDIOGRAM REPORT      IMPRESSIONS    1. Left ventricular ejection fraction, by estimation, is 20 to 25%. The left ventricle has severely decreased function. The left ventricle demonstrates global hypokinesis. There is moderate concentric left ventricular hypertrophy. Left ventricular  diastolic parameters are consistent with Grade III diastolic dysfunction (restrictive).  2. Right ventricular systolic function is moderately reduced. The right ventricular size is normal. There is normal pulmonary artery systolic pressure. The estimated right ventricular systolic pressure is 33.3 mmHg.  3. Left atrial size was severely dilated.  4. Right atrial size was mildly dilated.  5. Moderate pleural effusion in the left lateral region.  6. The mitral valve is degenerative. Mild to moderate mitral valve regurgitation. Mild mitral stenosis.  7. The aortic valve is tricuspid. There is mild calcification of the aortic valve. Aortic valve regurgitation is mild. Aortic valve sclerosis/calcification is present, without any evidence of aortic stenosis.  8. The inferior vena cava is normal in size with greater than 50% respiratory variability, suggesting right atrial pressure of 3 mmHg.  Conclusion(s)/Recommendation(s): Consider evalaution for infiltrative cardiomopathy, if clinically indicated.            CAD  - On  statin, betablocker,                 -  followed by cardiology                    Stress cardiac MRI 5/25 was done, showing LVEF 21%, RVEF 31%, subendocardial LGE (>50% wall thickness) basal inferior wall suggestive of prior MI, basal septal mid-wall LGE (nonspecific, can see with dilated cardiomyopathy). Perfusion defect on stress images in the basal inferior wall (fixed, suggestive of prior MI)      Hx of DVT/PE   now off AC     CKD stage IIIb baseline Cr1.6 CrCl cannot be calculated (Unknown ideal weight.).  Lab Results  Component Value Date   CREATININE 13.40 (H) 06/05/2024   CREATININE 1.55 (H) 03/24/2024   CREATININE 1.57 (H) 03/17/2024   Lab Results  Component Value Date   NA 132 (L) 06/05/2024   CL 91 (L) 06/05/2024   K 5.0 06/05/2024   CO2 13 (L) 06/05/2024   BUN 159 (H) 06/05/2024   CREATININE 13.40 (H) 06/05/2024   GFRNONAA 2 (L) 06/05/2024   CALCIUM  9.9 06/05/2024   ALBUMIN 3.6 06/05/2024   GLUCOSE 134 (H) 06/05/2024     While in ER:   Found to have an acute on chronic renal failure with creatinine up to 13 Foley placed with minimal urine output    Lab Orders         CBC with Differential         Comprehensive metabolic panel         Pro Brain natriuretic peptide         Lipase, blood      CXR -  NON acute    Following Medications were ordered in ER: Medications - No data to display  _______________________________________________________ ER Provider Called:     Nephrology   Dr. Jerrye They Recommend admit to medicine to [facility], place foley  obtain CT abd Will see  in ER     ED Triage Vitals [06/05/24 1600]  Encounter Vitals Group     BP 101/64     Girls Systolic BP Percentile      Girls Diastolic BP Percentile      Boys Systolic BP Percentile      Boys Diastolic BP Percentile      Pulse Rate 79     Resp 17     Temp (!) 97.5 F (36.4 C)     Temp Source Oral     SpO2 100 %     Weight      Height      Head Circumference      Peak Flow      Pain Score      Pain Loc      Pain Education      Exclude from Growth Chart   [id number]     _________________________________________ Significant initial  Findings: Abnormal Labs Reviewed  CBC WITH DIFFERENTIAL/PLATELET - Abnormal; Notable for the following components:      Result Value   WBC 13.0 (*)    Neutro Abs 11.9 (*)    Lymphs Abs 0.4 (*)    Abs Immature Granulocytes 0.10 (*)  All other  components within normal limits  COMPREHENSIVE METABOLIC PANEL WITH GFR - Abnormal; Notable for the following components:   Sodium 132 (*)    Chloride 91 (*)    CO2 13 (*)    Glucose, Bld 134 (*)    BUN 159 (*)    Creatinine, Ser 13.40 (*)    GFR, Estimated 2 (*)    Anion gap 29 (*)    All other components within normal limits  PRO BRAIN NATRIURETIC PEPTIDE - Abnormal; Notable for the following components:   Pro Brain Natriuretic Peptide 34,718.0 (*)    All other components within normal limits  LIPASE, BLOOD - Abnormal; Notable for the following components:   Lipase 72 (*)    All other components within normal limits  TROPONIN T, HIGH SENSITIVITY - Abnormal; Notable for the following components:   Troponin T High Sensitivity 184 (*)    All other components within normal limits     ECG: Ordered Personally reviewed and interpreted by me showing: HR : 74 Rhythm: Sinus rhythm Atrial premature complex Left bundle branch block QTC 454  BNP (last 3 results) Recent Labs    03/17/24 1546  BNP 1,471.2*       The recent clinical data is shown below. Vitals:   06/05/24 1600 06/05/24 1928  BP: 101/64 (!) 149/63  Pulse: 79 74  Resp: 17 15  Temp: (!) 97.5 F (36.4 C) 98.2 F (36.8 C)  TempSrc: Oral Oral  SpO2: 100% 100%     WBC     Component Value Date/Time   WBC 13.0 (H) 06/05/2024 1747   LYMPHSABS 0.4 (L) 06/05/2024 1747   MONOABS 0.6 06/05/2024 1747   EOSABS 0.0 06/05/2024 1747   BASOSABS 0.0 06/05/2024 1747      UA   ordered    __________________________________________________________ Recent Labs  Lab 06/05/24 1747  NA 132*  K 5.0  CO2 13*  GLUCOSE 134*  BUN 159*  CREATININE 13.40*  CALCIUM  9.9    Cr   stable,   Lab Results  Component Value Date   CREATININE 13.40 (H) 06/05/2024   CREATININE 1.55 (H) 03/24/2024   CREATININE 1.57 (H) 03/17/2024    Recent Labs  Lab 06/05/24 1747  AST 19  ALT 16  ALKPHOS 58  BILITOT 0.7  PROT 7.7   ALBUMIN 3.6   Lab Results  Component Value Date   CALCIUM  9.9 06/05/2024    Plt: Lab Results  Component Value Date   PLT 232 06/05/2024       Recent Labs  Lab 06/05/24 1747  WBC 13.0*  NEUTROABS 11.9*  HGB 12.2  HCT 38.2  MCV 92.0  PLT 232    HG/HCT stable,      Component Value Date/Time   HGB 12.2 06/05/2024 1747   HCT 38.2 06/05/2024 1747   MCV 92.0 06/05/2024 1747     Recent Labs  Lab 06/05/24 1747  LIPASE 72*   No results for input(s): AMMONIA in the last 168 hours.    _______________________________________________ Hospitalist was called for admission for AKI   The following Work up has been ordered so far:  Orders Placed This Encounter  Procedures   DG Chest 2 View   CT ABDOMEN PELVIS WO CONTRAST   CBC with Differential   Comprehensive metabolic panel   Pro Brain natriuretic peptide   Lipase, blood   Insert foley catheter   Consult to hospitalist   EKG 12-Lead     OTHER Significant initial  Findings:  labs showing:     DM  labs:  HbA1C: Recent Labs    03/17/24 1546  HGBA1C 6.2*    CBG (last 3)  No results for input(s): GLUCAP in the last 72 hours.        Cultures:    Component Value Date/Time   SDES URINE, CLEAN CATCH 02/19/2024 1726   SPECREQUEST NONE 02/19/2024 1726   CULT (A) 02/19/2024 1726    <10,000 COLONIES/mL INSIGNIFICANT GROWTH Performed at [facility], [address] [place], [place] [address]    REPTSTATUS 02/22/2024 FINAL 02/19/2024 1726     Radiological Exams on Admission: DG Chest 2 View Result Date: 06/05/2024 CLINICAL DATA:  Shortness of breath. EXAM: CHEST - 2 VIEW COMPARISON:  Feb 20, 2024. FINDINGS: Stable cardiomediastinal silhouette. Both lungs are clear. The visualized skeletal structures are unremarkable. IMPRESSION: No active cardiopulmonary disease. Electronically Signed   By: Lynwood Landy Raddle M.D.   On: 06/05/2024 17:00    _______________________________________________________________________________________________________ Latest  Blood pressure (!) 149/63, pulse 74, temperature 98.2 F (36.8 C), temperature source Oral, resp. rate 15, SpO2 100%.   Vitals  labs and radiology finding personally reviewed  Review of Systems:    Pertinent positives include:  confusion nausea, vomiting  Constitutional:  No weight loss, night sweats, Fevers, chills, fatigue, weight loss  HEENT:  No headaches, Difficulty swallowing,Tooth/dental problems,Sore throat,  No sneezing, itching, ear ache, nasal congestion, post nasal drip,  Cardio-vascular:  No chest pain, Orthopnea, PND, anasarca, dizziness, palpitations.no Bilateral lower extremity swelling  GI:  No heartburn, indigestion, abdominal pain,, diarrhea, change in bowel habits, loss of appetite, melena, blood in stool, hematemesis Resp:  no shortness of breath at rest. No dyspnea on exertion, No excess mucus, no productive cough, No non-productive cough, No coughing up of blood.No change in color of mucus.No wheezing. Skin:  no rash or lesions. No jaundice GU:  no dysuria, change in color of urine, no urgency or frequency. No straining to urinate.  No flank pain.  Musculoskeletal:  No joint pain or no joint swelling. No decreased range of motion. No back pain.  Psych:  No change in mood or affect. No depression or anxiety. No memory loss.  Neuro: no localizing neurological complaints, no tingling, no weakness, no double vision, no gait abnormality, no slurred speech, no   All systems reviewed and apart from HOPI all are negative _______________________________________________________________________________________________ Past Medical History:  No past medical history on file.    Past Surgical History:  Procedure Laterality Date   IR THORACENTESIS ASP PLEURAL SPACE W/IMG GUIDE  02/20/2024    Social History:  Ambulatory  cane  wheelchair bound,       reports that she has quit smoking. She has never used smokeless tobacco. She reports that she does not drink alcohol and does not use drugs.   Family History:   No family history on file. ______________________________________________________________________________________________ Allergies: No Known Allergies   Prior to Admission medications   Medication Sig Start Date End Date Taking? Authorizing Provider  aspirin  EC 81 MG tablet Take 1 tablet (81 mg total) by mouth daily. Swallow whole. 03/17/24   Rolan Ezra RAMAN, MD  empagliflozin  (JARDIANCE ) 10 MG TABS tablet Take 1 tablet (10 mg total) by mouth daily before breakfast. 02/29/24   Clegg, Amy D, NP  furosemide  (LASIX ) 40 MG tablet Take 1 tablet (40 mg total) by mouth daily as needed (for weight gain of 3 pounds). 02/29/24   Lenetta Greig BIRCH, NP  metoprolol  succinate (TOPROL -XL) 25 MG 24 hr tablet Take 0.5 tablets (12.5 mg total) by mouth daily. 02/24/24   Singh, Prashant K, MD  OVER THE COUNTER MEDICATION Take 2 tablets by mouth every morning. *otc called blood flow*    [provider]  rosuvastatin  (CRESTOR ) 40 MG tablet Take 1 tablet (40 mg total) by mouth daily. 03/17/24   Rolan Ezra RAMAN, MD  sacubitril -valsartan  (ENTRESTO ) 24-26 MG Take 1 tablet by mouth 2 (two) times daily. 03/17/24   Rolan Ezra RAMAN, MD  sertraline  (ZOLOFT ) 25 MG tablet Take 1 tablet (25 mg total) by mouth daily. 05/23/24   Rolan Ezra RAMAN, MD    ___________________________________________________________________________________________________ Physical Exam:    06/05/2024    7:28 PM 06/05/2024    4:00 PM 03/17/2024    2:28 PM  Vitals with BMI  Weight   178 lbs 13 oz  Systolic 149 101 859  Diastolic 63 64 70  Pulse 74 79 77    1. General:  in No  Acute distress   Chronically ill -appearing 2. Psychological: Alert and   Oriented 3. Head/ENT:    Dry Mucous Membranes                          Head Non traumatic, neck supple                         Poor  Dentition 4. SKIN: decreased Skin turgor,  Skin clean Dry stage II sacral decub ulcer    5. Heart: Regular rate and rhythm no  Murmur, no Rub or gallop 6. Lungs:   no wheezes or crackles   7. Abdomen: Soft,  non-tender, Non distended   bowel sounds present 8. Lower extremities: no clubbing, cyanosis, no  edema 9. Neurologically Grossly intact, moving all 4 extremities equally   10. MSK: Normal range of motion    Chart has been reviewed  ______________________________________________________________________________________________  Assessment/Plan 83 y.o. 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%, PE, 4.2 cm ascending aortic aneurysm, SVT  Admitted for AKI   Present on Admission:  AKI (acute kidney injury) (HCC)  Elevated troponin  HFrEF (heart failure with reduced ejection fraction) (HCC)  History of pulmonary embolus (PE)  Sacral decubitus ulcer, stage II (HCC)     AKI (acute kidney injury) (HCC) Appreciate nephrology consult obtain urinary electrolytes  CT abdomen Place Foley Strict I's and O's May benefit from gentle rehydration Overall very poor prognosis patient and family declined dialysis Would benefit from palliative care consult   Elevated troponin Close to baseline will continue to monitor in the setting of AKI Not endorsing any chest pain continue to cycle  HFrEF (heart failure with reduced ejection fraction) (HCC) Avoid over aggressive fluid resuscitation given severe combined systolic diastolic CHF.  Probably would benefit from cardiology consult to help manage this complex patient  History of pulmonary embolus (PE) No longer on anticoagulation  Sacral decubitus ulcer, stage II (HCC) Appreciate wound care consult   Other plan as per orders.  DVT prophylaxis:  SCD      Code Status:  DNR/DNI   as per patient  family  I had personally discussed CODE STATUS with patient and family   ACP   has been reviewed     Family  Communication:   Family  at  Bedside  plan of care was discussed  with   Son, Daughter,  Diet diabetic   Disposition Plan:      To home once workup is complete and patient is stable   Following barriers for discharge:                                                        Electrolytes corrected                                                      Pain controlled with PO medications                                                    Will need consultants to evaluate patient prior to discharge                                   Consult Orders  (From admission, onward)           Start     Ordered   06/05/24 1918  Consult to hospitalist  Once       Provider:  (Not yet assigned)  Question Answer Comment  Place call to: [facility]   Reason for Consult Admit      06/05/24 1917                               Would benefit from PT/OT eval prior to DC  Ordered                                     Transition of care consulted                   Nutrition    consulted                  Wound care  consulted                   Palliative care    consulted                                     Consults called: nephrology seen in ER Emailed cardiology    Admission status:  ED Disposition     ED Disposition  Admit   Condition  --   Comment  Hospital Area: [facility] [[id number]]  Level of Care: Progressive [102]  Admit to Progressive based on following criteria: NEPHROLOGY stable condition requiring close monitoring for AKI, requiring Hemodialysis or Peritoneal Dialysis either from expected electrolyte imbalance, acidosis, or fluid overload that can be managed by NIPPV or high flow oxygen.  May admit patient to [facility] or [facility] if equivalent level of care is available:: No  Covid Evaluation: Asymptomatic - no recent exposure (last 10  days) testing not required  Diagnosis: AKI (acute kidney injury) [facility]) [[id number]]  Admitting Physician: Jamera Vanloan,  Zarria Towell [[id number]]  Attending Physician: Aliea Bobe [[id number]]  Certification:: I certify this patient will need inpatient services for at least 2 midnights  Expected Medical Readiness: 06/07/2024           inpatient     I Expect 2 midnight stay secondary to severity of patient's current illness need for inpatient interventions justified by the following:     Severe lab/radiological/exam abnormalities including:   aki and extensive comorbidities including:  DM2   CHF   CAD  CKD   That are currently affecting medical management.   I expect  patient to be hospitalized for 2 midnights requiring inpatient medical care.  Patient is at high risk for adverse outcome (such as loss of life or disability) if not treated.  Indication for inpatient stay as follows:  Severe change from baseline regarding mental status    Need for IV fluids,,   Frequent labs    Level of care        progressive       Hollyn Stucky 06/05/2024, 8:45 PM    [facility]     after 2 AM please page floor coverage   If 7AM-7PM, please contact the day team taking care of the patient using [email or web address]

## 2024-06-05 NOTE — Assessment & Plan Note (Signed)
 Patient describes as more of a numbness tingling sensation.  Could be in the setting of uremia  Also check ABIs for completion Supportive management

## 2024-06-05 NOTE — Assessment & Plan Note (Signed)
 No longer on anticoagulation

## 2024-06-05 NOTE — Assessment & Plan Note (Signed)
-   Order Sensitive SSI  °  ° -  check TSH and HgA1C °  ° ° °

## 2024-06-05 NOTE — Assessment & Plan Note (Signed)
 Appreciate wound care consult.

## 2024-06-05 NOTE — ED Triage Notes (Signed)
 Pt BIB EMS from home, c/o bilateral foot pain x 3 weeks. Too weak to support weight, c/o numbness w/o tingling. Denies N/V/D  BP 118/68 P 100 SpO2 98% CBG 137

## 2024-06-06 ENCOUNTER — Inpatient Hospital Stay (HOSPITAL_COMMUNITY)

## 2024-06-06 ENCOUNTER — Encounter (HOSPITAL_COMMUNITY): Payer: Self-pay | Admitting: Internal Medicine

## 2024-06-06 DIAGNOSIS — I5022 Chronic systolic (congestive) heart failure: Secondary | ICD-10-CM

## 2024-06-06 DIAGNOSIS — Z66 Do not resuscitate: Secondary | ICD-10-CM

## 2024-06-06 DIAGNOSIS — N179 Acute kidney failure, unspecified: Secondary | ICD-10-CM | POA: Diagnosis not present

## 2024-06-06 DIAGNOSIS — I5020 Unspecified systolic (congestive) heart failure: Secondary | ICD-10-CM

## 2024-06-06 DIAGNOSIS — E1122 Type 2 diabetes mellitus with diabetic chronic kidney disease: Secondary | ICD-10-CM

## 2024-06-06 DIAGNOSIS — M79672 Pain in left foot: Secondary | ICD-10-CM

## 2024-06-06 DIAGNOSIS — N1831 Chronic kidney disease, stage 3a: Secondary | ICD-10-CM

## 2024-06-06 DIAGNOSIS — Z7189 Other specified counseling: Secondary | ICD-10-CM | POA: Diagnosis not present

## 2024-06-06 DIAGNOSIS — Z515 Encounter for palliative care: Secondary | ICD-10-CM | POA: Diagnosis not present

## 2024-06-06 DIAGNOSIS — Z789 Other specified health status: Secondary | ICD-10-CM

## 2024-06-06 DIAGNOSIS — Z86711 Personal history of pulmonary embolism: Secondary | ICD-10-CM

## 2024-06-06 DIAGNOSIS — M79671 Pain in right foot: Secondary | ICD-10-CM

## 2024-06-06 DIAGNOSIS — R4589 Other symptoms and signs involving emotional state: Secondary | ICD-10-CM

## 2024-06-06 LAB — URINALYSIS, COMPLETE (UACMP) WITH MICROSCOPIC
Bilirubin Urine: NEGATIVE
Glucose, UA: 150 mg/dL — AB
Ketones, ur: NEGATIVE mg/dL
Nitrite: NEGATIVE
Protein, ur: 100 mg/dL — AB
RBC / HPF: >50 RBC/hpf (ref 0–5)
Specific Gravity, Urine: 1.01 (ref 1.005–1.030)
WBC, UA: >50 WBC/hpf (ref 0–5)
pH: 5 (ref 5.0–8.0)

## 2024-06-06 LAB — BASIC METABOLIC PANEL WITH GFR
Anion gap: 24 — ABNORMAL HIGH (ref 5–15)
BUN: 191 mg/dL — ABNORMAL HIGH (ref 8–23)
CO2: 17 mmol/L — ABNORMAL LOW (ref 22–32)
Calcium: 8.9 mg/dL (ref 8.9–10.3)
Chloride: 92 mmol/L — ABNORMAL LOW (ref 98–111)
Creatinine, Ser: 13.3 mg/dL — ABNORMAL HIGH (ref 0.44–1.00)
GFR, Estimated: 3 mL/min — ABNORMAL LOW (ref >60)
Glucose, Bld: 83 mg/dL (ref 70–99)
Potassium: 4.5 mmol/L (ref 3.5–5.1)
Sodium: 133 mmol/L — ABNORMAL LOW (ref 135–145)

## 2024-06-06 LAB — COMPREHENSIVE METABOLIC PANEL WITH GFR
ALT: 18 U/L (ref 0–44)
AST: 19 U/L (ref 15–41)
Albumin: 2.6 g/dL — ABNORMAL LOW (ref 3.5–5.0)
Alkaline Phosphatase: 37 U/L — ABNORMAL LOW (ref 38–126)
Anion gap: 23 — ABNORMAL HIGH (ref 5–15)
BUN: 187 mg/dL — ABNORMAL HIGH (ref 8–23)
CO2: 16 mmol/L — ABNORMAL LOW (ref 22–32)
Calcium: 8.8 mg/dL — ABNORMAL LOW (ref 8.9–10.3)
Chloride: 91 mmol/L — ABNORMAL LOW (ref 98–111)
Creatinine, Ser: 13.24 mg/dL — ABNORMAL HIGH (ref 0.44–1.00)
GFR, Estimated: 3 mL/min — ABNORMAL LOW (ref >60)
Glucose, Bld: 99 mg/dL (ref 70–99)
Potassium: 4.7 mmol/L (ref 3.5–5.1)
Sodium: 130 mmol/L — ABNORMAL LOW (ref 135–145)
Total Bilirubin: 0.8 mg/dL (ref 0.0–1.2)
Total Protein: 6.5 g/dL (ref 6.5–8.1)

## 2024-06-06 LAB — GLUCOSE, CAPILLARY
Glucose-Capillary: 78 mg/dL (ref 70–99)
Glucose-Capillary: 121 mg/dL — ABNORMAL HIGH (ref 70–99)
Glucose-Capillary: 110 mg/dL — ABNORMAL HIGH (ref 70–99)
Glucose-Capillary: 85 mg/dL (ref 70–99)

## 2024-06-06 LAB — PHOSPHORUS: Phosphorus: 9.2 mg/dL — ABNORMAL HIGH (ref 2.5–4.6)

## 2024-06-06 LAB — CBC WITH DIFFERENTIAL/PLATELET
Abs Immature Granulocytes: 0.08 K/uL — ABNORMAL HIGH (ref 0.00–0.07)
Basophils Absolute: 0 K/uL (ref 0.0–0.1)
Basophils Relative: 0 %
Eosinophils Absolute: 0.1 K/uL (ref 0.0–0.5)
Eosinophils Relative: 1 %
HCT: 32.8 % — ABNORMAL LOW (ref 36.0–46.0)
Hemoglobin: 11.1 g/dL — ABNORMAL LOW (ref 12.0–15.0)
Immature Granulocytes: 1 %
Lymphocytes Relative: 6 %
Lymphs Abs: 0.6 K/uL — ABNORMAL LOW (ref 0.7–4.0)
MCH: 29.7 pg (ref 26.0–34.0)
MCHC: 33.8 g/dL (ref 30.0–36.0)
MCV: 87.7 fL (ref 80.0–100.0)
Monocytes Absolute: 0.8 K/uL (ref 0.1–1.0)
Monocytes Relative: 8 %
Neutro Abs: 8.4 K/uL — ABNORMAL HIGH (ref 1.7–7.7)
Neutrophils Relative %: 84 %
Platelets: 218 K/uL (ref 150–400)
RBC: 3.74 MIL/uL — ABNORMAL LOW (ref 3.87–5.11)
RDW: 14.4 % (ref 11.5–15.5)
WBC: 9.9 K/uL (ref 4.0–10.5)
nRBC: 0 % (ref 0.0–0.2)

## 2024-06-06 LAB — VITAMIN B12: Vitamin B-12: 428 pg/mL (ref 180–914)

## 2024-06-06 LAB — TROPONIN I (HIGH SENSITIVITY): Troponin I (High Sensitivity): 123 ng/L (ref <18)

## 2024-06-06 LAB — MAGNESIUM: Magnesium: 2.3 mg/dL (ref 1.7–2.4)

## 2024-06-06 LAB — LACTIC ACID, PLASMA: Lactic Acid, Venous: 1.1 mmol/L (ref 0.5–1.9)

## 2024-06-06 LAB — PREALBUMIN: Prealbumin: 11 mg/dL — ABNORMAL LOW (ref 18–38)

## 2024-06-06 LAB — OSMOLALITY: Osmolality: 350 mosm/kg (ref 275–295)

## 2024-06-06 MED ORDER — ONDANSETRON HCL 4 MG PO TABS
4.0000 mg | ORAL_TABLET | Freq: Four times a day (QID) | ORAL | Status: DC | PRN
  Administered 2024-06-09: 4 mg via ORAL
  Filled 2024-06-06: qty 1

## 2024-06-06 MED ORDER — ENSURE PLUS HIGH PROTEIN PO LIQD
237.0000 mL | Freq: Two times a day (BID) | ORAL | Status: DC
  Administered 2024-06-09: 237 mL via ORAL

## 2024-06-06 MED ORDER — SODIUM CHLORIDE 0.9 % IV SOLN
1.0000 g | INTRAVENOUS | Status: DC
  Administered 2024-06-06 – 2024-06-08 (×3): 1 g via INTRAVENOUS
  Filled 2024-06-06 (×3): qty 10

## 2024-06-06 MED ORDER — GERHARDT'S BUTT CREAM
TOPICAL_CREAM | Freq: Two times a day (BID) | CUTANEOUS | Status: DC
  Filled 2024-06-06 (×2): qty 60

## 2024-06-06 MED ORDER — ACETAMINOPHEN 650 MG RE SUPP: 650.0000 mg | Freq: Four times a day (QID) | RECTAL | Status: DC | PRN

## 2024-06-06 MED ORDER — ACETAMINOPHEN 325 MG PO TABS: 650.0000 mg | ORAL_TABLET | Freq: Four times a day (QID) | ORAL | Status: DC | PRN

## 2024-06-06 MED ORDER — ADULT MULTIVITAMIN W/MINERALS CH
1.0000 | ORAL_TABLET | Freq: Every day | ORAL | Status: DC
  Administered 2024-06-06: 1 via ORAL
  Filled 2024-06-06: qty 1

## 2024-06-06 MED ORDER — ONDANSETRON HCL 4 MG/2ML IJ SOLN
4.0000 mg | Freq: Four times a day (QID) | INTRAMUSCULAR | Status: DC | PRN
  Administered 2024-06-07: 4 mg via INTRAVENOUS
  Filled 2024-06-06: qty 2

## 2024-06-06 MED ORDER — METOPROLOL SUCCINATE ER 25 MG PO TB24: 12.5000 mg | ORAL_TABLET | Freq: Every day | ORAL | Status: DC

## 2024-06-06 MED ORDER — ROSUVASTATIN CALCIUM 20 MG PO TABS
40.0000 mg | ORAL_TABLET | Freq: Every day | ORAL | Status: DC
  Administered 2024-06-06 – 2024-06-09 (×4): 40 mg via ORAL
  Filled 2024-06-06 (×4): qty 2

## 2024-06-06 MED ORDER — ENSURE PLUS HIGH PROTEIN PO LIQD
237.0000 mL | Freq: Three times a day (TID) | ORAL | Status: DC
  Administered 2024-06-06: 237 mL via ORAL

## 2024-06-06 MED ORDER — FENTANYL CITRATE PF 50 MCG/ML IJ SOSY: 12.5000 ug | PREFILLED_SYRINGE | INTRAMUSCULAR | Status: DC | PRN

## 2024-06-06 MED ORDER — MEDIHONEY WOUND/BURN DRESSING EX PSTE
1.0000 | PASTE | Freq: Every day | CUTANEOUS | Status: DC
  Administered 2024-06-06 – 2024-06-09 (×2): 1 via TOPICAL
  Filled 2024-06-06 (×2): qty 44

## 2024-06-06 NOTE — Progress Notes (Signed)
 Nephrology Follow-Up Consult note   Assessment/Recommendations: Regina Bell is a/an 83 y.o. female with a past medical history significant for CAD, pulmonary embolism, combined systolic and diastolic heart failure first noted in May 2025, and CKD stage 3 who presented to the ER with 3 weeks of foot discomfort, numbness, and tingling.    Non-Oliguric AKI (unchanged): Likely secondary to prolonged pre-renal insults in the setting of optimizing her heart failure and a recent decline in her health and functional status; c/b n/v and poor intake plus staghorn calculi/renal stones & emphysematous cystitis -Hold diuretic, Jardiance , Entresto   - Patient stated she only wants medical management at this point.  Does not want dialysis or urology consult.  -Continue to monitor daily Cr, Dose meds for GFR -Monitor Daily I/Os, Daily weight  -Maintain MAP>65 for optimal renal perfusion.  -Avoid nephrotoxic medications including NSAIDs -Use synthetic opioids (Fentanyl /Dilaudid) if needed - Patient does not want dialysis  Hypertension: Acceptable.  Entresto  held.  Metabolic acidosis: Improving 2/2 AKI.   Bicarb drip   Recommendations conveyed to primary service.   Nephrology will sign off at this time.  Please reconsult if needed   Regina Bell [facility] 06/06/2024 7:14 AM  ___________________________________________________________  CC: Foot pain  Interval History/Subjective: No major events reported overnight.  Seen in bedside chair.  Conversational.  Foley with 300 mL.   Medications:  Current Facility-Administered Medications  Medication Dose Route Frequency Provider Last Rate Last Admin   acetaminophen  (TYLENOL ) tablet 650 mg  650 mg Oral Q6H PRN Doutova, Anastassia, MD       Or   acetaminophen  (TYLENOL ) suppository 650 mg  650 mg Rectal Q6H PRN Doutova, Anastassia, MD       fentaNYL  (SUBLIMAZE ) injection 12.5-50 mcg  12.5-50 mcg Intravenous Q2H PRN Doutova,  Anastassia, MD       Gerhardt's butt cream   Topical BID Doutova, Anastassia, MD       insulin  aspart (novoLOG ) injection 0-6 Units  0-6 Units Subcutaneous TID WC Doutova, Anastassia, MD       leptospermum manuka honey (MEDIHONEY) paste 1 Application  1 Application Topical Daily Doutova, Anastassia, MD       ondansetron  (ZOFRAN ) tablet 4 mg  4 mg Oral Q6H PRN Doutova, Anastassia, MD       Or   ondansetron  (ZOFRAN ) injection 4 mg  4 mg Intravenous Q6H PRN Doutova, Anastassia, MD       rosuvastatin  (CRESTOR ) tablet 40 mg  40 mg Oral Daily Doutova, Anastassia, MD       sodium bicarbonate  150 mEq in sterile water  1,150 mL infusion   Intravenous Continuous Jerrye Katheryn BROCKS, MD 75 mL/hr at 06/05/24 2111 New Bag at 06/05/24 2111      Review of Systems: 10 systems reviewed and negative except per interval history/subjective  Physical Exam: Vitals:   06/05/24 2320 06/06/24 0317  BP: (!) 169/58 (!) 149/62  Pulse: 67 72  Resp: 18 18  Temp: 97.7 F (36.5 C) 97.6 F (36.4 C)  SpO2: 96%    No intake/output data recorded. No intake or output data in the 24 hours ending 06/06/24 1212  Constitutional: Elderly, no acute distress CV: normal rate, no edema Respiratory: clear to auscultation, normal work of breathing Gastrointestinal: soft, non-tender, no palpable masses or hernias Skin: no visible lesions or rashes Psych: alert, judgement/insight appropriate, appropriate mood and affect   Test Results I personally reviewed new and old clinical labs and radiology tests Lab Results  Component Value Date  NA 130 (L) 06/06/2024   K 4.7 06/06/2024   CL 91 (L) 06/06/2024   CO2 16 (L) 06/06/2024   BUN 187 (H) 06/06/2024   CREATININE 13.24 (H) 06/06/2024   CALCIUM  8.8 (L) 06/06/2024   ALBUMIN 2.6 (L) 06/06/2024   PHOS 9.2 (H) 06/06/2024    CBC Recent Labs  Lab 06/05/24 1747 06/06/24 0245  WBC 13.0* 9.9  NEUTROABS 11.9* 8.4*  HGB 12.2 11.1*  HCT 38.2 32.8*  MCV 92.0 87.7  PLT 232 218

## 2024-06-06 NOTE — Progress Notes (Signed)
 Regina Bell [id number] [facility] Liaison Note:   [facility] recently received a referral from this patient's PCP to provide hospice services. She was hospitalized prior to services being started.    Hospital liaisons will follow while she remains admitted.   Please call with any hospice or outpatient palliative care related questions.    Eleanor Nail, LPN [facility] Liaison [phone]

## 2024-06-06 NOTE — Consult Note (Signed)
 Urology Consult Note   Requesting Attending Physician:  Fairy Frames, MD Service Providing Consult: Urology  Consulting Attending: Dr. Nieves   Reason for Consult:  emphysematous cystitis, staghorn stones, renal failure  HPI: Regina Bell is seen in consultation for reasons noted above at the request of Fairy Frames, MD. 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  She is followed by Dr. Mady in the CHF clinic and takes Lasix  as needed.  Creatinine baseline is 1.6-1.7 and was found to be elevated to 13.4 on arrival.  CT A/P noted significant emphysematous cystitis and pyelitis with bilateral nonobstructing stones. Urology was consulted to speak to these findings.  Patient was accompanied by her daughter and granddaughter.  Reviewed case and plan.  They declined to view imaging.  All questions were answered to their satisfaction. 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 Initially misplaced on CT. Draining clear yellow urine.   Gas in right collecting system- appreciated in the bladder wall with rising gas in right ureter and renal collecting system.  Bilateral stones do not appear to be obstructing at this time. Can discuss PCNL depending on cardiac recovery/appropriateness of anesthesia.   Nephrology following. ARF felt to be pre-renal 2/2 HF exacerbation. EF 20-25% 02/20/24.   Urology will follow peripherally.  Will plan to reassess imaging in 1 week or sooner if patient should decompensate acutely.  Case and plan discussed with Dr. Nieves  Past Medical  History: History reviewed. No pertinent past medical history.  Past Surgical History:  Past Surgical History:  Procedure Laterality Date   IR THORACENTESIS ASP PLEURAL SPACE W/IMG GUIDE  02/20/2024    Medication: Current Facility-Administered Medications  Medication Dose Route Frequency Provider Last Rate Last Admin   acetaminophen  (TYLENOL ) tablet 650 mg  650 mg Oral Q6H PRN Doutova, Anastassia, MD       Or   acetaminophen  (TYLENOL ) suppository 650 mg  650 mg Rectal Q6H PRN Doutova, Anastassia, MD       cefTRIAXone  (ROCEPHIN ) 1 g in sodium chloride  0.9 % 100 mL IVPB  1 g Intravenous Q24H Joseph, Preetha, MD 200 mL/hr at 06/06/24 1227 1 g at 06/06/24 1227   feeding supplement (ENSURE PLUS HIGH PROTEIN) liquid 237 mL  237 mL Oral BID BM Fairy Frames, MD       fentaNYL  (SUBLIMAZE ) injection 12.5-50 mcg  12.5-50 mcg Intravenous Q2H PRN Doutova, Anastassia, MD       Gerhardt's butt cream   Topical BID Doutova, Anastassia, MD   Given at 06/06/24 1223   insulin  aspart (novoLOG ) injection 0-6 Units  0-6 Units Subcutaneous TID WC Doutova, Anastassia, MD       leptospermum manuka honey (MEDIHONEY) paste 1 Application  1 Application Topical Daily Doutova, Anastassia, MD   1 Application at 06/06/24 1223   ondansetron  (ZOFRAN ) tablet 4 mg  4 mg Oral Q6H PRN Doutova, Anastassia, MD       Or   ondansetron  (ZOFRAN ) injection 4 mg  4 mg Intravenous Q6H PRN Doutova, Anastassia, MD       rosuvastatin  (CRESTOR ) tablet 40 mg  40 mg Oral Daily Doutova, Anastassia, MD   40 mg at 06/06/24  1028   sodium bicarbonate  150 mEq in sterile water  1,150 mL infusion   Intravenous Continuous Jerrye Katheryn BROCKS, MD 75 mL/hr at 06/06/24 1520 New Bag at 06/06/24 1520    Allergies: No Known Allergies  Social History: Social History   Tobacco Use   Smoking status: Former   Smokeless tobacco: Never  Advertising account planner   Vaping status: Never Used  Substance Use Topics   Alcohol use: No   Drug use: No    Family History No  family history on file.  Review of Systems  Genitourinary:  Negative for dysuria, flank pain, frequency, hematuria and urgency.     Objective   Vital signs in last 24 hours: BP (!) 150/63 (BP Location: Left Arm)   Pulse 63   Temp 97.6 F (36.4 C) (Oral)   Resp 16   Ht 5' 5 (1.651 m)   Wt 71.7 kg   SpO2 100%   BMI 26.30 kg/m   Physical Exam: General: A&O, resting, appropriate HEENT: [place]/AT Pulmonary: Normal work of breathing Cardiovascular: no cyanosis Abdomen: Soft, NTTP, nondistended GU: foley in place draining clear yellow urine Neuro: Appropriate, no focal neurological deficits  Most Recent Labs: Lab Results  Component Value Date   WBC 9.9 06/06/2024   HGB 11.1 (L) 06/06/2024   HCT 32.8 (L) 06/06/2024   PLT 218 06/06/2024    Lab Results  Component Value Date   NA 133 (L) 06/06/2024   K 4.5 06/06/2024   CL 92 (L) 06/06/2024   CO2 17 (L) 06/06/2024   BUN 191 (H) 06/06/2024   CREATININE 13.30 (H) 06/06/2024   CALCIUM  8.9 06/06/2024   MG 2.3 06/06/2024   PHOS 9.2 (H) 06/06/2024    Lab Results  Component Value Date   INR 1.1 06/05/2024     Urine Culture: @[id number] (laburin,org,[id number],[id number])@   IMAGING: CT ABDOMEN PELVIS WO CONTRAST Result Date: 06/05/2024 CLINICAL DATA:  Bilateral foot pain numbness tingling leukocytosis and acute kidney injury per epic notes EXAM: CT ABDOMEN AND PELVIS WITHOUT CONTRAST TECHNIQUE: Multidetector CT imaging of the abdomen and pelvis was performed following the standard protocol without IV contrast. RADIATION DOSE REDUCTION: This exam was performed according to the departmental dose-optimization program which includes automated exposure control, adjustment of the mA and/or kV according to patient size and/or use of iterative reconstruction technique. COMPARISON:  Kidney ultrasound 02/20/2024, CT 02/12/2017, chest CT 02/19/2024 FINDINGS: Lower chest: Lung bases demonstrate bandlike atelectasis or scarring at the bases. Left  lower lobe subpleural nodularity measuring 19 x 9 mm on series 2, image 23. Cardiomegaly. Small hiatal hernia. Dense mitral calcifications. Coronary vascular calcification. Hepatobiliary: Sludge versus small stones in the gallbladder. No biliary dilatation Pancreas: Unremarkable. No pancreatic ductal dilatation or surrounding inflammatory changes. Spleen: Normal in size without focal abnormality. Adrenals/Urinary Tract: Adrenal glands are normal. 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 No left hydronephrosis. Multiple large right-sided kidney stones, the largest is seen in the right renal pelvis and measures up to 2.6 cm. There is mild right hydronephrosis. Moderate gas within the right renal collecting system. Gas within the right ureter which is also slightly dilated proximally. Moderate gas within the urinary bladder. 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 Stomach/Bowel: The stomach is nonenlarged. No dilated small bowel. Diverticular disease of the left colon without acute wall thickening. Vascular/Lymphatic: Aortic atherosclerosis. No enlarged abdominal or pelvic lymph nodes. Reproductive: Probable small calcified uterine fibroids. No adnexal mass. Foley catheter appears to be  looped within the vagina. Other: Negative for pelvic effusion or free air. Musculoskeletal: No acute or suspicious osseous abnormality. IMPRESSION: 1. Foley catheter appears to be looped within the vagina. 2. Moderate gas within the right renal collecting system and right ureter. Moderate gas within the urinary bladder. 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 Gas within the right renal collecting system and ureter could reflect refluxed gas from the bladder versus gas forming upper urinary tract infection. 3. Multiple large right-sided kidney stones, the largest is seen in the right  renal pelvis and measures up to 2.6 cm. There is mild right hydronephrosis. 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 4. Left lower lobe subpleural nodularity measuring 19 x 9 mm. This could be due to scarring versus subpleural nodule, suggest short interim CT follow-up in 3-6 months to reassess 5. Sludge versus small stones in the gallbladder. 6. Diverticular disease of the left colon without acute wall thickening. 7. Aortic atherosclerosis. Critical Value/emergent results were called by telephone at the time of interpretation on 06/05/2024 at 10:22 pm to provider DAN FLOYD , who verbally acknowledged these results. Aortic Atherosclerosis (ICD10-I70.0) and Emphysema (ICD10-J43.9). Electronically Signed   By: Luke Bun M.D.   On: 06/05/2024 22:23   DG Chest 2 View Result Date: 06/05/2024 CLINICAL DATA:  Shortness of breath. EXAM: CHEST - 2 VIEW COMPARISON:  Feb 20, 2024. FINDINGS: Stable cardiomediastinal silhouette. Both lungs are clear. The visualized skeletal structures are unremarkable. IMPRESSION: No active cardiopulmonary disease. Electronically Signed   By: Lynwood Landy Raddle M.D.   On: 06/05/2024 17:00    ------  Ole Bourdon, NP Pager: [phone]   Please contact the urology consult pager with any further questions/concerns.

## 2024-06-06 NOTE — Evaluation (Signed)
 Physical Therapy Evaluation Patient Details Name: Regina Bell MRN: [id number] DOB: 28-Jan-1941 Today's Date: 06/06/2024  History of Present Illness  Pt is a 83 y.o female admitted 06/05/24 for LB pain and AKI. PMH: CAD, CHF, PE, DM ascending aortic aneurysm, SVT  Clinical Impression  Pt lethargic, able to state name and respond to questions though disoriented to time and place as well as unable to recall whether her bedroom is upstairs or downstairs. Pt requires encouragement and assist to progress mobility with SPO2 >90% throughout on RA. Pt with decreased strength, function, transfers, cognition and gait who will benefit from acute therapy to maximize mobility and independence to decrease burden of care.      If plan is discharge home, recommend the following: A little help with walking and/or transfers;A little help with bathing/dressing/bathroom;Assistance with cooking/housework;Assist for transportation;Direct supervision/assist for financial management;Supervision due to cognitive status;Help with stairs or ramp for entrance   Can travel by private vehicle        Equipment Recommendations Other (comment) (stair lift if pt bed on 2nd floor)  Recommendations for Other Services       Functional Status Assessment Patient has had a recent decline in their functional status and demonstrates the ability to make significant improvements in function in a reasonable and predictable amount of time.     Precautions / Restrictions Precautions Precautions: Fall Recall of Precautions/Restrictions: Impaired Precaution/Restrictions Comments: Rt foot drop      Mobility  Bed Mobility               General bed mobility comments: in chair on arrival and end of session    Transfers Overall transfer level: Needs assistance   Transfers: Sit to/from Stand Sit to Stand: Min assist           General transfer comment: min assist to rise from chair with cues for hand placement, pt  holding Rw with decreased control of descent despite cues    Ambulation/Gait Ambulation/Gait assistance: Min assist Gait Distance (Feet): 80 Feet Assistive device: Rolling walker (2 wheels) Gait Pattern/deviations: Step-through pattern, Decreased stride length, Decreased dorsiflexion - right, Trunk flexed   Gait velocity interpretation: <1.8 ft/sec, indicate of risk for recurrent falls   General Gait Details: mod cues for posture and proximity to RW with pt unable to maintain. Pt with Rt foot drop, reports no brace and that it started 3 weeks ago  Careers information officer     Tilt Bed    Modified Rankin (Stroke Patients Only)       Balance Overall balance assessment: Needs assistance Sitting-balance support: Feet supported, No upper extremity supported Sitting balance-Leahy Scale: Fair Sitting balance - Comments: in chair   Standing balance support: Bilateral upper extremity supported, Reliant on assistive device for balance, During functional activity Standing balance-Leahy Scale: Poor Standing balance comment: Reliant on RW                             Pertinent Vitals/Pain Pain Assessment Pain Assessment: No/denies pain    Home Living Family/patient expects to be discharged to:: Private residence Living Arrangements: Children Available Help at Discharge: Family;Available PRN/intermittently Type of Home: House Home Access: Stairs to enter   Entrance Stairs-Number of Steps: 2 Alternate Level Stairs-Number of Steps: 14 Home Layout: Two level Home Equipment: Cane - single point;Shower seat;Grab bars - tub/shower;Rolling Walker (2 wheels) Additional Comments: pt  providing information but lethargic    Prior Function Prior Level of Function : Independent/Modified Independent             Mobility Comments: reports mod I with use of RW ADLs Comments: reports mod I, and daughter is in and out of the house most of the day      Extremity/Trunk Assessment   Upper Extremity Assessment Upper Extremity Assessment: Generalized weakness    Lower Extremity Assessment Lower Extremity Assessment: Overall WFL for tasks assessed;RLE deficits/detail RLE Deficits / Details: 4/5 :hip flexion, knee flexion and extension. 0/5 dorsiflexion    Cervical / Trunk Assessment Cervical / Trunk Assessment: Normal  Communication   Communication Communication: No apparent difficulties    Cognition Arousal: Lethargic Behavior During Therapy: WFL for tasks assessed/performed   PT - Cognitive impairments: Orientation   Orientation impairments: Time, Place                   PT - Cognition Comments: stating [place] and Aug 31 Following commands: Impaired Following commands impaired: Follows one step commands with increased time     Cueing Cueing Techniques: Verbal cues     General Comments      Exercises     Assessment/Plan    PT Assessment Patient needs continued PT services  PT Problem List Decreased strength;Decreased coordination;Decreased cognition;Decreased activity tolerance;Decreased balance;Decreased mobility;Decreased safety awareness;Decreased knowledge of use of DME       PT Treatment Interventions DME instruction;Balance training;Gait training;Stair training;Functional mobility training;Patient/family education;Therapeutic activities;Therapeutic exercise;Cognitive remediation;Neuromuscular re-education    PT Goals (Current goals can be found in the Care Plan section)  Acute Rehab PT Goals PT Goal Formulation: Patient unable to participate in goal setting Time For Goal Achievement: 06/20/24 Potential to Achieve Goals: Fair    Frequency Min 2X/week     Co-evaluation               AM-PAC PT 6 Clicks Mobility  Outcome Measure Help needed turning from your back to your side while in a flat bed without using bedrails?: A Little Help needed moving from lying on your back to  sitting on the side of a flat bed without using bedrails?: A Little Help needed moving to and from a bed to a chair (including a wheelchair)?: A Little Help needed standing up from a chair using your arms (e.g., wheelchair or bedside chair)?: A Little Help needed to walk in hospital room?: A Little Help needed climbing 3-5 steps with a railing? : A Lot 6 Click Score: 17    End of Session Equipment Utilized During Treatment: Gait belt Activity Tolerance: Patient tolerated treatment well Patient left: in chair;with call bell/phone within reach;with chair alarm set Nurse Communication: Mobility status PT Visit Diagnosis: Other abnormalities of gait and mobility (R26.89);Difficulty in walking, not elsewhere classified (R26.2)    Time: 1114-1140 PT Time Calculation (min) (ACUTE ONLY): 26 min   Charges:   PT Evaluation $PT Eval Moderate Complexity: 1 Mod PT Treatments $Gait Training: 8-22 mins PT General Charges $$ ACUTE PT VISIT: 1 Visit         Lenoard SQUIBB, PT Acute Rehabilitation Services Office: [phone]   Lenoard NOVAK Fitzhugh Vizcarrondo 06/06/2024, 12:01 PM

## 2024-06-06 NOTE — ED Notes (Signed)
 cDate and time results received: 06/06/24 0055 am (use smartphrase .now to insert current time)  Test: Osmolality  Critical Value: 350  Name of Provider Notified: Doutova and Rathore  Orders Received? Or Actions Taken?: Patient transfer from [facility] to IP 3E07, contacted admission provider Doutova as well as [facility] swing coverage Rathore. Called nurse patient is moved to at [facility] to make aware of critical result as well.

## 2024-06-06 NOTE — Consult Note (Signed)
 Palliative Care Consult Note                                  Date: 06/06/2024   Patient Name: Regina Bell  DOB: [birth year]  MRN: [id number]  Age / Sex: 83 y.o., female  PCP: Cicero Aureliano SAUNDERS, MD Referring Physician: Fairy Frames, MD  Reason for Consultation: Establishing goals of care  History reviewed. No pertinent past medical history.  Subjective:   This NP Camellia Kays reviewed medical records, received report from team, assessed the patient and then meet at the patient's bedside to discuss diagnosis, prognosis, GOC, EOL wishes disposition and options.  Before meeting with the patient/family, I spent time reviewing the chart notes including admission H&P from yesterday, ED note from yesterday, nursing note from today, hospice liaison note from today, nephrology note from today, cardiology note from today, OT and PT notes from today, internal medicine note from today. I also reviewed vital signs, nursing flowsheets, medication administrations record, labs, and imaging. Labs reviewed include BMP which shows persistent hyponatremia, stable but massively elevated creatinine at 13.3 consistent with known severe AKI.  CBC shows no leukocytosis at 9.9 today, although it was elevated at 13.0 a day ago.  Other indices declined including RBC, hemoglobin, hematocrit, platelets with possible dilution effect given hydration and previously prevented in the severely dehydrated state.  I met with the patient at bedside, no family was present.  After meeting with the patient I did call and speak with her daughter Regina Bell whom she lives with.   We meet to discuss diagnosis prognosis, GOC, EOL wishes, disposition and options. Concept of Palliative Care was introduced as specialized medical care for people and their families living with serious illness.  If focuses on providing relief from the symptoms and stress of a serious illness.  The goal is to  improve quality of life for both the patient and the family. Values and goals of care important to patient and family were attempted to be elicited.  Created space and opportunity for patient  and family to explore thoughts and feelings regarding current medical situation   Natural trajectory and current clinical status were discussed. Questions and concerns addressed. Patient  encouraged to call with questions or concerns.    Patient/Family Understanding of Illness: Patient knows that she has numb feet and neuropathy that is causing her pain.  She states the rest of her feels fine.  She knows she has kidney disease and her kidneys took a big hit.  She agrees that she has decided she would not want dialysis, even if it meant end-of-life.  She states that she had a mild heart attack but she did not even know about which caused some heart failure.  We spent some time discussing her clinical status.  When I spoke with her daughter, she confirmed his understanding as well and we also further discussed her clinical status.  Life Review: The patient has a son and daughter, both of whom live locally.  She lives with her daughter.  She is originally from [place]  but lived in [place]  for some time as well.  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  She previously enjoyed swimming, bike riding.  Today's Discussion: In addition to discussions described above we had extensive discussion of various topics.  She understands that she is quite sick, and there is a chance that  her kidneys would not recover.  We talked about options moving forward including continued current care and treat the treatable without aggressive interventions such as dialysis.  She is in agreement with this and confirms this is of what she wants.    We also discussed previous referral to hospice.  She indicated her understanding of hospice is somewhere for you to go and stay while you die.   We clarified hospice further. I described hospice as a service for patients who have a life expectancy of 6 months or less. The goal of hospice is the preservation of dignity and quality at the end phases of life. Under hospice care, the focus changes from curative to symptom relief. I explained the three setting where hospice services can be provided including the home, at a living facility (such as LTC SNF, Assisted Living, etc), and a hospice facility. I explained that acceptance to hospice in any specific location is the final decision of the hospice medical director and bed availability, if applicable. They verbalized understanding.  I offered referral to hospice for the liaison to come explained further and she is in agreement with this.  After speaking with the patient I called her daughter Regina Bell.  I introduced palliative medicine and our role in the care with her mother.  She is familiar with the palliative service and expressed appreciation for our work while her father was in the ICU last year, as well as transition to inpatient hospice for end-of-life.  We discussed her mom's clinical status and my conversation with her mother.  Daughter Regina Bell is in agreement for ongoing conservative treatment, engagement with hospice liaison.  She states that she is leaving now for the hospital and hopes to participate in these conversations as well.  Her daughter had questions about hospice.  I again explained hospice, as detailed above and my explanation to the patient.  Daughter feels that outpatient hospice/home hospice would be appropriate with in-home caregivers to assist with 24-hour care.  We also discussed possibility of transition to inpatient hospice when her health declines substantially on home hospice.  I shared that the palliative medicine team would allow a couple days for conservative treatment and will check back on Sunday.  I provided contact information for any questions or concerns in the  interim.  I provided emotional and general support through therapeutic listening, empathy, sharing of stories, and other techniques. I answered all questions and addressed all concerns to the best of my ability.  Afterward I debriefed with the medical team and signed off to a colleague to check in over the weekend.  Goals: Continue conservative treatment, time for outcomes.  Referral to engage with hospice to consider home hospice at discharge.  Further options pending clinical progress in the next 44 to 48 hours.  Review of Systems  Constitutional:  Positive for fatigue.  Respiratory:  Negative for chest tightness and shortness of breath.   Cardiovascular:  Negative for chest pain.  Gastrointestinal:  Negative for abdominal pain, nausea and vomiting.  Neurological:  Positive for numbness (Bilateral feet).       Bilateral neuropathic foot pain    Objective:   Primary Diagnoses: Present on Admission:  AKI (acute kidney injury) (HCC)  Elevated troponin  HFrEF (heart failure with reduced ejection fraction) (HCC)  History of pulmonary embolus (PE)  Sacral decubitus ulcer, stage II (HCC)  Type 2 diabetes mellitus with chronic kidney disease, without long-term current use of insulin  (HCC)  Pain in both feet  Vital Signs:  BP (!) 147/53 (BP Location: Left Arm)   Pulse 60   Temp 97.7 F (36.5 C) (Oral)   Resp 17   Ht 5' 5 (1.651 m)   Wt 71.7 kg   SpO2 93%   BMI 26.30 kg/m   Physical Exam Vitals and nursing note reviewed.  Constitutional:      General: She is not in acute distress.    Appearance: She is ill-appearing.     Comments: Appears frail and tired, but appropriately engaging in conversation  HENT:     Head: Normocephalic and atraumatic.  Cardiovascular:     Rate and Rhythm: Normal rate.  Pulmonary:     Effort: Pulmonary effort is normal. No respiratory distress.  Abdominal:     General: Abdomen is flat.     Palpations: Abdomen is soft.  Skin:    General:  Skin is warm and dry.  Neurological:     General: No focal deficit present.     Mental Status: She is alert and oriented to person, place, and time.  Psychiatric:        Mood and Affect: Mood normal.        Behavior: Behavior normal.     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 Individuals present for the conversation: The patient, daughter Regina Bell Camellia Kays, NP  Summary of the conversation: Discussed current clinical status, CODE STATUS, options moving forward from aggressive treatment to conservative medical management to comfort/hospice.  Outcome of the conversations and/or documents completed: Continue conservative treatment, NO HD, engagement with hospice liaison for home hospice discussions  I spent 20 minutes providing separately identifiable ACP services with the patient and/or surrogate decision maker in a voluntary, in-person conversation discussing the patient's wishes and goals as detailed in the above note.  Assessment & Plan:   HPI/Patient Profile: 83 y.o. female  with past medical history of CAD, chronic systolic CHF, PE, ascending aortic aneurysm, SVT presented to the ED with worsening bilateral foot pain and numbness for several weeks, in addition reported overall weakness, decreased oral intake, vomiting.  She was admitted on 06/05/2024 with severe AKI, AGMA, chronic combined systolic/diastolic CHF, peripheral neuropathy, and others.   Palliative medicine was consulted for GOC conversations.  SUMMARY OF RECOMMENDATIONS   DNR-Limited (DNR/DNI) Continue conservative  treatment No hemodialysis Continue to treat the treatable Engagement with hospice liaison to discuss home hospice option Palliative medicine will check in in a couple days Please notify us  of any significant clinical change or new needs in the interim  Symptom Management:  Per primary team Palliative medicine is available to assist as needed  Code Status: DNR - Limited (DNR/DNI)  Prognosis:  < 6 months  Discharge Planning:  To Be Determined   Discussed with: Patient, patient's family, medical team, nursing team, hospice liaison, TOC    Thank you for allowing us  to participate in the care of Richerd JULIANNA Cargo PMT will continue to support holistically.  Billing based on MDM: High  Problems Addressed: One acute or chronic illness or injury that poses a threat to life or bodily function  Amount and/or Complexity of Data: Category 1:Review of prior  external note(s) from each unique source, Review of the result(s) of each unique test, and Assessment requiring an independent historian(s) and Category 3:Discussion of management or test interpretation with external physician/other qualified health care professional/appropriate source (not separately reported)  Risks: N/A  Detailed review of medical records (labs, imaging, vital signs), medically appropriate exam, discussed with treatment team, counseling and education to patient, family, & staff, documenting clinical information, medication management, coordination of care  Signed by: Camellia Kays, NP Palliative Medicine Team  Team Phone # [phone] (Nights/Weekends)  06/06/2024, 2:07 PM

## 2024-06-06 NOTE — Consult Note (Signed)
 WOC Nurse Consult Note: Reason for Consult: sacral wound  Wound type: 1. Unstageable Pressure Injury Sacrum 100% dark necrotic tissue  2.  Stage 3 Pressure Injury R buttock 50% red 50% tan fibrinous  3.  Stage 2 Pressure Injury L ischium pink moist  Pressure Injury POA: Yes Measurement: see nursing flowsheet  Wound bed: as above  Drainage (amount, consistency, odor) see nursing flowsheet  Periwound: Sacral wound appears to have started as a deep tissue injury with some purple maroon discoloration noted; Buttocks with Moisture Associated Skin Damage with erythema and scattered partial thickness skin loss  Dressing procedure/placement/frequency: Cleanse sacral and R buttock wounds with Vashe wound cleanser Soila [id number]) do not rinse and allow to air dry. Apply Medihoney to wound beds daily, cover with dry gauze and secure with silicone foam or ABD pads.  Cleanse L ischial Stage 2 with Vashe and cover with silicone foam. Lift foam daily to assess area.     Will order Gerhardt's Butt Cream for intact skin of buttocks.    POC discussed with bedside nurse WOC team will not follow. Re-consult if further needs arise.   Thank you,    Powell Bar MSN, RN-BC, [employer]

## 2024-06-06 NOTE — Progress Notes (Signed)
 Initial Nutrition Assessment  DOCUMENTATION CODES:   Not applicable  INTERVENTION:  -Liberalize diet to regular menu, regular texture, thin liquids -Add Ensure Plus High Protein BID -Encouraged pt to increase PO intake as able, discussed importance of adequate intake.   NUTRITION DIAGNOSIS:   Inadequate oral intake related to lethargy/confusion, chronic illness, acute illness, decreased appetite, wound healing as evidenced by meal completion < 25%.  GOAL:   Patient will meet greater than or equal to 90% of their needs  MONITOR:   PO intake, Supplement acceptance, Labs, Weight trends, Skin  REASON FOR ASSESSMENT:   Consult Assessment of nutrition requirement/status  ASSESSMENT:   Hx CAD, pulmonary embolism, combined systolic and diastolic heart failure first noted in May 2025, and CKD stage 3 who presented to the ER with 3 weeks of foot discomfort, numbness, and tingling.  Spoke to pt chairside while eating lunch. Pt denies n/v/c/d or chewing/swallowing difficulties. Last BM PTA. Pt answering questions seemingly appropriately, however, does take some time to answer questions, didn't answer all questions. Pt had not eaten much of lunch yet. Pt currently on carb modified diet, will liberalize to regular to promote increased intake. Add Ensure Plus High Protein BID. Pt with documented weight loss of -13.7 kg (16%) x 3 months, which is severe. Defer NFPE at this time, complete at f/u. Discussed importance of adequate kcal/pro intake for healing, fueling body; encouraged pt to increase PO intake as able.   Per MD notes, pt does not want dialysis or urology consult; medical management only. If AKI on CKD3 does not improve and dialysis is indicated, she would consider hospice/comfort measures.   Pt denies additional questions/concerns at this time, will continue to monitor, RDN available prn.   Labs BG 78-122 Na 133 Chloride 92 CO2 17 BUN 191 Cr 13.3 Phos 9.2 ALK 37 Albumin  2.6 Prealbumin 11 GFR 3 H/H 11.1/32.8  Medications  feeding supplement  237 mL Oral TID BM   Gerhardt's butt cream   Topical BID   insulin  aspart  0-6 Units Subcutaneous TID WC   leptospermum manuka honey  1 Application Topical Daily   rosuvastatin   40 mg Oral Daily    Diet Order:   Diet Order             Diet regular Room service appropriate? Yes with Assist; Fluid consistency: Thin  Diet effective now                   EDUCATION NEEDS:   Education needs have been addressed  Skin:  Skin Assessment: Skin Integrity Issues: Skin Integrity Issues:: Stage II, Stage III, Unstageable Stage II: Ischial tuberosity left Stage III: R buttocks Unstageable: Bilateral Sacrum  Last BM:  PTA  Height:   Ht Readings from Last 1 Encounters:  06/05/24 5' 5 (1.651 m)    Weight:   Wt Readings from Last 1 Encounters:  06/06/24 71.7 kg    BMI:  Body mass index is 26.3 kg/m.  Estimated Nutritional Needs:   Kcal:  1425-1800 kcal  Protein:  85-110 g  Fluid:  >/=1.5 L  Regina Bell, RDN, LDN Registered Dietitian Nutritionist RD Inpatient Contact Info in [place]

## 2024-06-06 NOTE — Consult Note (Addendum)
 Advanced Heart Failure Team Consult Note   Primary Physician: Cicero Aureliano SAUNDERS, MD Cardiologist:  Annabella Scarce, MD [facility]: Dr. Rolan   Reason for Consultation: Management of chronic systolic heart failure in the setting of worsening renal failure   HPI:    Regina Bell is seen today for management of systolic heart failure at the request of Dr. Fairy, Internal Medicine.   83 y/o female w/ a history of PE in 2018, CKD stage 3, SVT, and chronic systolic CHF.  Patient had a non-triggered PE in 2018; she is no longer on anticoagulation and has not had a recurrence. In 5/25, she was admitted with progressive exertional dyspnea over about a week.  No chest pain.  Echo showed EF 20-25%, moderate LVH, moderate RV dysfunction, mild-moderate MR.  Troponin was mildly elevated but there was no trend. 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 Perfusion defect on stress images in the basal inferior wall (fixed, suggestive of prior MI). She was diuresed and felt better.  While in the hospital, she had SVT terminated by adenosine . She was set up for cardiac cath 7/25 but she canceled the procedure.    She has been more bedbound recently. Limited mobility and failure to thrive. 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  There have also been issues w/ poor compliance. Has recently refused to come in the the [facility] for appointment and f/u labs.   She came to the ED yesterday primarily for b/l foot pain and noted to have worsening renal failure w/ SCr up to 13.40. BUN 159 on admit and has further risen to 191 today. Nephrology was consulted but pt said she would not want HD. Family is considering hospice.   She is not grossly volume overloaded on exam. No respiratory  difficulty. Despite BUN, she is answering questions appropriately, A&Ox 3. K 4.5. SBP 150s-160s. Only complaint is b/l foot pain/neuropathy.   Home Medications Prior to Admission medications   Medication Sig Start Date End Date Taking? Authorizing Provider  aspirin  EC 81 MG tablet Take 1 tablet (81 mg total) by mouth daily. Swallow whole. 03/17/24  Yes Rolan Ezra RAMAN, MD  empagliflozin  (JARDIANCE ) 10 MG TABS tablet Take 1 tablet (10 mg total) by mouth daily before breakfast. 02/29/24  Yes Clegg, Amy D, NP  furosemide  (LASIX ) 40 MG tablet Take 1 tablet (40 mg total) by mouth daily as needed (for weight gain of 3 pounds). 02/29/24  Yes Clegg, Amy D, NP  OVER THE COUNTER MEDICATION Take 2 tablets by mouth See admin instructions. Blood flow* tablets - Take 2 tablets by mouth once a day   Yes [provider]  rosuvastatin  (CRESTOR ) 40 MG tablet Take 1 tablet (40 mg total) by mouth daily. 03/17/24  Yes Rolan Ezra RAMAN, MD  sacubitril -valsartan  (ENTRESTO ) 24-26 MG Take 1 tablet by mouth 2 (two) times daily. 03/17/24  Yes Rolan Ezra RAMAN, MD  UNKNOWN TO PATIENT Place 1 drop into the left eye See admin instructions. Unnamed eye drops (name not recalled by patient and [facility] did not have on file/they were called): Instill 1 drop into the affected left eye in the morning, afternoon, and evening for tearing and irritation   Yes [provider]  metoprolol  succinate (TOPROL -XL) 25 MG 24 hr tablet Take 0.5 tablets (  12.5 mg total) by mouth daily. Patient not taking: Reported on 06/05/2024 02/24/24   Singh, Prashant K, MD  sertraline  (ZOLOFT ) 25 MG tablet Take 1 tablet (25 mg total) by mouth daily. Patient not taking: Reported on 06/05/2024 05/23/24   Rolan Ezra RAMAN, MD    Past Medical History: History reviewed. No pertinent past medical history.  Past Surgical History: Past Surgical History:  Procedure Laterality Date   IR THORACENTESIS ASP PLEURAL SPACE W/IMG GUIDE  02/20/2024    Family  History: No family history on file.  Social History: Social History   Socioeconomic History   Marital status: Widowed    Spouse name: Not on file   Number of children: 2   Years of education: Not on file   Highest education level: High school graduate  Occupational History   Not on file  Tobacco Use   Smoking status: Former   Smokeless tobacco: Never  Vaping Use   Vaping status: Never Used  Substance and Sexual Activity   Alcohol use: No   Drug use: No   Sexual activity: Not on file  Other Topics Concern   Not on file  Social History Narrative   ** Merged History Encounter **       Social Drivers of Health   Financial Resource Strain: Low Risk  (02/21/2024)   Overall Financial Resource Strain (CARDIA)    Difficulty of Paying Living Expenses: Not very hard  Food Insecurity: No Food Insecurity (06/06/2024)   Hunger Vital Sign    Worried About Running Out of Food in the Last Year: Never true    Ran Out of Food in the Last Year: Never true  Transportation Needs: No Transportation Needs (06/06/2024)   PRAPARE - Administrator, Civil Service (Medical): No    Lack of Transportation (Non-Medical): No  Physical Activity: Not on file  Stress: Not on file  Social Connections: Moderately Integrated (06/06/2024)   Social Connection and Isolation Panel    Frequency of Communication with Friends and Family: Three times a week    Frequency of Social Gatherings with Friends and Family: Three times a week    Attends Religious Services: More than 4 times per year    Active Member of Clubs or Organizations: Yes    Attends Banker Meetings: More than 4 times per year    Marital Status: Widowed    Allergies:  No Known Allergies  Objective:    Vital Signs:   Temp:  [97.5 F (36.4 C)-98.2 F (36.8 C)] 97.6 F (36.4 C) (08/29 0317) Pulse Rate:  [67-79] 72 (08/29 0317) Resp:  [15-18] 18 (08/29 0317) BP: (101-169)/(58-64) 149/62 (08/29 0317) SpO2:  [96  %-100 %] 96 % (08/28 2320) Weight:  [71.5 kg-71.7 kg] 71.7 kg (08/29 0317)    Weight change: Filed Weights   06/05/24 2320 06/06/24 0317  Weight: 71.5 kg 71.7 kg    Intake/Output:  No intake or output data in the 24 hours ending 06/06/24 0735    Physical Exam   GENERAL: elderly, chronically ill appearing NAD Lungs- clear  CARDIAC:  no JVD         Normal rate with regular rhythm. Trace b/l ankle edema  ABDOMEN: Soft, non-tender, non-distended.  EXTREMITIES: Warm and well perfused.  NEUROLOGIC: No obvious FND GU: + foely     Telemetry   NSR 80s, pheromonally reviewed   EKG    N/A   Labs   Basic Metabolic Panel: Recent Labs  Lab  06/05/24 1747 06/05/24 2216 06/06/24 0245  NA 132*  --  130*  K 5.0  --  4.7  CL 91*  --  91*  CO2 13*  --  16*  GLUCOSE 134*  --  99  BUN 159*  --  187*  CREATININE 13.40*  --  13.24*  CALCIUM  9.9  --  8.8*  MG  --  2.6* 2.3  PHOS  --  9.0* 9.2*    Liver Function Tests: Recent Labs  Lab 06/05/24 1747 06/06/24 0245  AST 19 19  ALT 16 18  ALKPHOS 58 37*  BILITOT 0.7 0.8  PROT 7.7 6.5  ALBUMIN 3.6 2.6*   Recent Labs  Lab 06/05/24 1747  LIPASE 72*   No results for input(s): AMMONIA in the last 168 hours.  CBC: Recent Labs  Lab 06/05/24 1747 06/06/24 0245  WBC 13.0* 9.9  NEUTROABS 11.9* 8.4*  HGB 12.2 11.1*  HCT 38.2 32.8*  MCV 92.0 87.7  PLT 232 218    Cardiac Enzymes: Recent Labs  Lab 06/05/24 2216  CKTOTAL 25*    BNP: BNP (last 3 results) Recent Labs    03/17/24 1546  BNP 1,471.2*    ProBNP (last 3 results) Recent Labs    02/19/24 0933 06/05/24 1747  PROBNP >35,000.0* 34,718.0*     CBG: Recent Labs  Lab 06/05/24 2210 06/06/24 0649  GLUCAP 122* 78    Coagulation Studies: Recent Labs    06/05/24 07-04-2215  LABPROT 15.1  INR 1.1     Imaging   CT ABDOMEN PELVIS WO CONTRAST Result Date: 06/05/2024 CLINICAL DATA:  Bilateral foot pain numbness tingling leukocytosis and acute  kidney injury per epic notes EXAM: CT ABDOMEN AND PELVIS WITHOUT CONTRAST TECHNIQUE: Multidetector CT imaging of the abdomen and pelvis was performed following the standard protocol without IV contrast. RADIATION DOSE REDUCTION: This exam was performed according to the departmental dose-optimization program which includes automated exposure control, adjustment of the mA and/or kV according to patient size and/or use of iterative reconstruction technique. COMPARISON:  Kidney ultrasound 02/20/2024, CT 02/12/2017, chest CT 02/19/2024 FINDINGS: Lower chest: Lung bases demonstrate bandlike atelectasis or scarring at the bases. Left lower lobe subpleural nodularity measuring 19 x 9 mm on series 2, image 23. Cardiomegaly. Small hiatal hernia. Dense mitral calcifications. Coronary vascular calcification. Hepatobiliary: Sludge versus small stones in the gallbladder. No biliary dilatation Pancreas: Unremarkable. No pancreatic ductal dilatation or surrounding inflammatory changes. Spleen: Normal in size without focal abnormality. Adrenals/Urinary Tract: Adrenal glands are normal. 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 No left hydronephrosis. Multiple large right-sided kidney stones, the largest is seen in the right renal pelvis and measures up to 2.6 cm. There is mild right hydronephrosis. Moderate gas within the right renal collecting system. Gas within the right ureter which is also slightly dilated proximally. Moderate gas within the urinary bladder. 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 Stomach/Bowel: The stomach is nonenlarged. No dilated small bowel. Diverticular disease of the left colon without acute wall thickening. Vascular/Lymphatic: Aortic atherosclerosis. No enlarged abdominal or pelvic lymph nodes. Reproductive: Probable small calcified uterine fibroids. No adnexal mass. Foley catheter appears to be looped within the vagina. Other:  Negative for pelvic effusion or free air. Musculoskeletal: No acute or suspicious osseous abnormality. IMPRESSION: 1. Foley catheter appears to be looped within the vagina. 2. Moderate gas within the right renal collecting system and right ureter. Moderate gas within the urinary bladder. Non dependent  peripherally located gas collections along the wall of the bladder concerning for emphysematous cystitis. Gas within the right renal collecting system and ureter could reflect refluxed gas from the bladder versus gas forming upper urinary tract infection. 3. Multiple large right-sided kidney stones, the largest is seen in the right renal pelvis and measures up to 2.6 cm. There is mild right hydronephrosis. 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 4. Left lower lobe subpleural nodularity measuring 19 x 9 mm. This could be due to scarring versus subpleural nodule, suggest short interim CT follow-up in 3-6 months to reassess 5. Sludge versus small stones in the gallbladder. 6. Diverticular disease of the left colon without acute wall thickening. 7. Aortic atherosclerosis. Critical Value/emergent results were called by telephone at the time of interpretation on 06/05/2024 at 10:22 pm to provider DAN FLOYD , who verbally acknowledged these results. Aortic Atherosclerosis (ICD10-I70.0) and Emphysema (ICD10-J43.9). Electronically Signed   By: Luke Bun M.D.   On: 06/05/2024 22:23   DG Chest 2 View Result Date: 06/05/2024 CLINICAL DATA:  Shortness of breath. EXAM: CHEST - 2 VIEW COMPARISON:  Feb 20, 2024. FINDINGS: Stable cardiomediastinal silhouette. Both lungs are clear. The visualized skeletal structures are unremarkable. IMPRESSION: No active cardiopulmonary disease. Electronically Signed   By: Lynwood Landy Raddle M.D.   On: 06/05/2024 17:00     Medications:     Current Medications:  Gerhardt's butt cream   Topical BID   insulin  aspart  0-6 Units Subcutaneous TID WC   leptospermum manuka  honey  1 Application Topical Daily   rosuvastatin   40 mg Oral Daily    Infusions:  sodium bicarbonate  150 mEq in sterile water  1,150 mL infusion 75 mL/hr at 06/05/24 2111     Assessment/Plan   83 y/o female w/ chronic systolic heart failure. Cardiomyopathy of uncertain etiology. 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 Situation complicated by apparent CKD stage 3 that has progressed. Now w/ severe renal failure. SCr now 13. BUN 191. Also w/ recent FTT and limited mobility due to significant bilateral foot neuropathy. Has been mostly bedbound recently.   Pt is clear that she does not want HD nor any aggressive treatment. Given comorbidties, we agree and feel hospice is appropriate.   From a HF standpoint, she is not fluid overloaded and in no respiratory difficulty. No indication for diuretics currently. Renal fx also limits most GDMT. Could add hydral/imur for afterload reduction but it seems family may be opting towards transition to hospice soon.   Nothing left for the AHF team to offer.   We will sign off.    Length of Stay: 1  Caffie Shed, PA-C  06/06/2024, 7:35 AM    Advanced Heart Failure Team Pager [phone] (M-F; 7a - 5p)  Please contact [facility] for night-coverage after hours (4p -7a ) and weekends on [email or web address]

## 2024-06-06 NOTE — Evaluation (Signed)
 Occupational Therapy Evaluation Patient Details Name: Regina Bell MRN: [id number] DOB: [birth year] Today's Date: 06/06/2024   History of Present Illness   Pt is a 83 y.o female admitted 06/05/24 for LB pain and AKI. PMH: CAD, CHF, PE, DM ascending aortic aneurysm, SVT     Clinical Impressions Pt admitted based on above, and was seen based on problem list below. Pt reports mod I with ADLs PTA, however pt lethargic and unable to fully state home set up and PLOF. Today pt is requiring set up  to mod assist for  ADLs. Bed mobility and functional transfers are  mod assist with RW. Pt lethargic, so will continue to assess cog at future sessions. Anticipate pt will progress well and would benefit from HHOT and increased supervision at home d/t cog. OT will continue to follow acutely to maximize functional independence.        If plan is discharge home, recommend the following:   A little help with walking and/or transfers;A little help with bathing/dressing/bathroom;Assistance with cooking/housework;Supervision due to cognitive status     Functional Status Assessment   Patient has had a recent decline in their functional status and demonstrates the ability to make significant improvements in function in a reasonable and predictable amount of time.     Equipment Recommendations   BSC/3in1      Precautions/Restrictions   Precautions Precautions: Fall Recall of Precautions/Restrictions: Impaired Restrictions Weight Bearing Restrictions Per Provider Order: No     Mobility Bed Mobility Overal bed mobility: Needs Assistance Bed Mobility: Supine to Sit     Supine to sit: Min assist, HOB elevated, Used rails     General bed mobility comments: Min HH assist for trunk, cueing for sequencing, significantly increased time    Transfers Overall transfer level: Needs assistance Equipment used: Rolling walker (2 wheels) Transfers: Sit to/from Stand, Bed to  chair/wheelchair/BSC Sit to Stand: Mod assist     Step pivot transfers: Min assist     General transfer comment: Good hand placement, mod assist to rise, once in standing min assist for balance and managing RW      Balance Overall balance assessment: Needs assistance Sitting-balance support: Bilateral upper extremity supported, Feet supported Sitting balance-Leahy Scale: Poor Sitting balance - Comments: slight posterior lean, pt sitting with eyes closed, likely impacting performance   Standing balance support: Bilateral upper extremity supported, Reliant on assistive device for balance, During functional activity Standing balance-Leahy Scale: Poor Standing balance comment: Reliant on RW       ADL either performed or assessed with clinical judgement   ADL Overall ADL's : Needs assistance/impaired Eating/Feeding: Set up;Sitting   Grooming: Set up;Sitting;Wash/dry face     Upper Body Dressing : Set up;Sitting   Lower Body Dressing: Moderate assistance Lower Body Dressing Details (indicate cue type and reason): Assist to thread legs and pull above waist in standing Toilet Transfer: Minimal assistance;Rolling walker (2 wheels) Toilet Transfer Details (indicate cue type and reason): Simulated in room short step pivot to recliner Toileting- Clothing Manipulation and Hygiene: Minimal assistance;Sit to/from stand Toileting - Clothing Manipulation Details (indicate cue type and reason): Assist to steady in standing     Functional mobility during ADLs: Minimal assistance;Rolling walker (2 wheels) General ADL Comments: Pt lethargic, likely impacting balance and performance     Vision Patient Visual Report: No change from baseline Vision Assessment?: No apparent visual deficits            Pertinent Vitals/Pain Pain Assessment Pain Assessment: Faces Faces  Pain Scale: Hurts a little bit Pain Location: bil feet Pain Descriptors / Indicators: Discomfort Pain Intervention(s):  Repositioned     Extremity/Trunk Assessment Upper Extremity Assessment Upper Extremity Assessment: Generalized weakness   Lower Extremity Assessment Lower Extremity Assessment: Defer to PT evaluation   Cervical / Trunk Assessment Cervical / Trunk Assessment: Normal   Communication Communication Communication: No apparent difficulties   Cognition Arousal: Lethargic Behavior During Therapy: WFL for tasks assessed/performed Cognition: Difficult to assess Difficult to assess due to: Level of arousal           OT - Cognition Comments: pt lethargic, often with eyes closed. A & O x4 but likely stm deficits at baseline, unable to provide full home setup. Will continue to assess at future sessions       Following commands: Impaired Following commands impaired: Follows one step commands with increased time     Cueing  General Comments   Cueing Techniques: Verbal cues  VSS on RA           Home Living Family/patient expects to be discharged to:: Private residence Living Arrangements: Children Available Help at Discharge: Family;Available PRN/intermittently Type of Home: House Home Access: Stairs to enter [employer] of Steps: 2 Entrance Stairs-Rails: None Home Layout: Two level Alternate Level Stairs-Number of Steps: flight   Bathroom Shower/Tub: Producer, television/film/video: Standard Bathroom Accessibility: No   Home Equipment: Cane - single point;Shower seat;Grab bars - Chartered loss adjuster (2 wheels)   Additional Comments: Most of home setup retrieved from prior admission, pt lethargic and unable to provide bathroom set up or state whether or not she lives on the main level      Prior Functioning/Environment Prior Level of Function : Independent/Modified Independent       Mobility Comments: reports mod I with use of RW ADLs Comments: reports mod I, and daughter is in and out of the house most of the day    OT Problem List: Decreased  strength;Decreased range of motion;Decreased activity tolerance;Impaired balance (sitting and/or standing);Decreased safety awareness;Decreased knowledge of use of DME or AE;Cardiopulmonary status limiting activity;Impaired sensation;Pain   OT Treatment/Interventions: Self-care/ADL training;Therapeutic exercise;Energy conservation;DME and/or AE instruction;Therapeutic activities;Patient/family education;Balance training      OT Goals(Current goals can be found in the care plan section)   Acute Rehab OT Goals Patient Stated Goal: To sleep OT Goal Formulation: With patient Time For Goal Achievement: 06/20/24 Potential to Achieve Goals: Good   OT Frequency:  Min 2X/week       AM-PAC OT 6 Clicks Daily Activity     Outcome Measure Help from another person eating meals?: None Help from another person taking care of personal grooming?: A Little Help from another person toileting, which includes using toliet, bedpan, or urinal?: A Little Help from another person bathing (including washing, rinsing, drying)?: A Lot Help from another person to put on and taking off regular upper body clothing?: A Little Help from another person to put on and taking off regular lower body clothing?: A Lot 6 Click Score: 17   End of Session Equipment Utilized During Treatment: Gait belt;Rolling walker (2 wheels) Nurse Communication: Mobility status  Activity Tolerance: Patient limited by lethargy Patient left: in chair;with call bell/phone within reach;with chair alarm set;with nursing/sitter in room  OT Visit Diagnosis: Unsteadiness on feet (R26.81);Other abnormalities of gait and mobility (R26.89);Muscle weakness (generalized) (M62.81)                Time: 9267-9198 OT Time Calculation (min):  29 min Charges:  OT General Charges $OT Visit: 1 Visit OT Evaluation $OT Eval Moderate Complexity: 1 Mod OT Treatments $Self Care/Home Management : 8-22 mins  Adrianne BROCKS, OT  Acute Rehabilitation  Services Office [phone] Secure chat preferred   Adrianne GORMAN Savers 06/06/2024, 8:22 AM

## 2024-06-06 NOTE — Progress Notes (Signed)
 PROGRESS NOTE    Regina Bell  FMW:[id number] DOB: 22-Sep-1941 DOA: 06/05/2024 PCP: Cicero Aureliano SAUNDERS, MD  83/F with history of CAD, chronic systolic CHF, PE, ascending aortic aneurysm, SVT presented to the ED with worsening bilateral foot pain and numbness for several weeks, in addition reported overall weakness, decreased oral intake, vomiting.  In the ED noted to have significant acute kidney injury with creatinine of 13, BUN 116, nephrology was consulted, transferred to [facility]. - Admitted, started on bicarb infusion, nephrology consulting, CT noted moderate gas within the right renal collecting system and right ureter. 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 Gas within the right renal collecting system and ureter could reflect refluxed gas from the bladder versus gas forming upper urinary tract infection. 3. Multiple large right-sided kidney stones, the largest is seen in the right renal pelvis and measures up to 2.6 cm. There is mild right hydronephrosis. 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 4. Left lower lobe subpleural nodularity measuring 19 x 9 mm.   Subjective: -Complains of bilateral foot pain  Assessment and Plan:  Acute kidney injury Anion gap metabolic acidosis - Multifactorial, suspect component of cardiorenal along with medications especially Entresto , in addition may be a component of mild right-sided hydronephrosis as well from multiple large renal stones - Foley catheter repositioned yesterday, continue IV fluids today - Will request urology evaluation - She would not want to pursue dialysis if this does not improve and would be willing to consider hospice/comfort care  Chronic combined systolic and diastolic CHF Last echo 5/25 with EF 25-30%, grade 3 DD, moderately reduced RV Diuretics on hold, Entresto  discontinued, see discussion above - Continue IV fluids for now  Peripheral  neuropathy -Check HbA1c, B12 and SPEP - Add gabapentin at low-dose  Normocytic anemia  Multiple renal calculi - See discussion above   DVT prophylaxis: add hep SQ Code Status: DNR Family Communication: No family at bedside Disposition Plan: Home eventually  Objective: Vitals:   06/05/24 1928 06/05/24 2320 06/06/24 0317 06/06/24 0803  BP: (!) 149/63 (!) 169/58 (!) 149/62 (!) 156/68  Pulse: 74 67 72 74  Resp: 15 18 18 16   Temp: 98.2 F (36.8 C) 97.7 F (36.5 C) 97.6 F (36.4 C) 97.6 F (36.4 C)  TempSrc: Oral Oral Oral Oral  SpO2: 100% 96%    Weight:  71.5 kg 71.7 kg   Height:  5' 5 (1.651 m)     No intake or output data in the 24 hours ending 06/06/24 1008 Filed Weights   06/05/24 2320 06/06/24 0317  Weight: 71.5 kg 71.7 kg    Examination:  General exam: Chronically ill pleasant eyes closed, oriented x 2 HEENT: No JVD CVS: S1-S2, regular rhythm Lungs: Decreased breath sounds at the bases Abdomen: Soft, nontender, bowel sounds present EXTR: No edema    Data Reviewed:   CBC: Recent Labs  Lab 06/05/24 1747 06/06/24 0245  WBC 13.0* 9.9  NEUTROABS 11.9* 8.4*  HGB 12.2 11.1*  HCT 38.2 32.8*  MCV 92.0 87.7  PLT 232 218   Basic Metabolic Panel: Recent Labs  Lab 06/05/24 1747 06/05/24 2216 06/06/24 0245 06/06/24 0801  NA 132*  --  130* 133*  K 5.0  --  4.7 4.5  CL 91*  --  91* 92*  CO2 13*  --  16* 17*  GLUCOSE 134*  --  99 83  BUN 159*  --  187* 191*  CREATININE 13.40*  --  13.24* 13.30*  CALCIUM  9.9  --  8.8* 8.9  MG  --  2.6* 2.3  --   PHOS  --  9.0* 9.2*  --    GFR: Estimated Creatinine Clearance: 3.2 mL/min (A) (by C-G formula based on SCr of 13.3 mg/dL (H)). Liver Function Tests: Recent Labs  Lab 06/05/24 1747 06/06/24 0245  AST 19 19  ALT 16 18  ALKPHOS 58 37*  BILITOT 0.7 0.8  PROT 7.7 6.5  ALBUMIN 3.6 2.6*   Recent Labs  Lab 06/05/24 1747  LIPASE 72*   No results for input(s): AMMONIA in the last 168  hours. Coagulation Profile: Recent Labs  Lab 06/05/24 2216  INR 1.1   Cardiac Enzymes: Recent Labs  Lab 06/05/24 2216  CKTOTAL 25*   BNP (last 3 results) Recent Labs    02/19/24 0933 06/05/24 1747  PROBNP >35,000.0* 34,718.0*   HbA1C: No results for input(s): HGBA1C in the last 72 hours. CBG: Recent Labs  Lab 06/05/24 2210 06/06/24 0649  GLUCAP 122* 78   Lipid Profile: No results for input(s): CHOL, HDL, LDLCALC, TRIG, CHOLHDL, LDLDIRECT in the last 72 hours. Thyroid  Function Tests: No results for input(s): TSH, T4TOTAL, FREET4, T3FREE, THYROIDAB in the last 72 hours. Anemia Panel: No results for input(s): VITAMINB12, FOLATE, FERRITIN, TIBC, IRON, RETICCTPCT in the last 72 hours. Urine analysis:    Component Value Date/Time   COLORURINE YELLOW 02/19/2024 0933   APPEARANCEUR HAZY (A) 02/19/2024 0933   LABSPEC 1.008 02/19/2024 0933   PHURINE 7.5 02/19/2024 0933   GLUCOSEU NEGATIVE 02/19/2024 0933   HGBUR NEGATIVE 02/19/2024 0933   BILIRUBINUR NEGATIVE 02/19/2024 0933   KETONESUR NEGATIVE 02/19/2024 0933   PROTEINUR NEGATIVE 02/19/2024 0933   NITRITE POSITIVE (A) 02/19/2024 0933   LEUKOCYTESUR LARGE (A) 02/19/2024 0933   Sepsis Labs: @LABRCNTIP (procalcitonin:4,lacticidven:4)  )No results found for this or any previous visit (from the past 240 hours).   Radiology Studies: CT ABDOMEN PELVIS WO CONTRAST Result Date: 06/05/2024 CLINICAL DATA:  Bilateral foot pain numbness tingling leukocytosis and acute kidney injury per epic notes EXAM: CT ABDOMEN AND PELVIS WITHOUT CONTRAST TECHNIQUE: Multidetector CT imaging of the abdomen and pelvis was performed following the standard protocol without IV contrast. RADIATION DOSE REDUCTION: This exam was performed according to the departmental dose-optimization program which includes automated exposure control, adjustment of the mA and/or kV according to patient size and/or use of iterative  reconstruction technique. COMPARISON:  Kidney ultrasound 02/20/2024, CT 02/12/2017, chest CT 02/19/2024 FINDINGS: Lower chest: Lung bases demonstrate bandlike atelectasis or scarring at the bases. Left lower lobe subpleural nodularity measuring 19 x 9 mm on series 2, image 23. Cardiomegaly. Small hiatal hernia. Dense mitral calcifications. Coronary vascular calcification. Hepatobiliary: Sludge versus small stones in the gallbladder. No biliary dilatation Pancreas: Unremarkable. No pancreatic ductal dilatation or surrounding inflammatory changes. Spleen: Normal in size without focal abnormality. Adrenals/Urinary Tract: Adrenal glands are normal. 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 No left hydronephrosis. Multiple large right-sided kidney stones, the largest is seen in the right renal pelvis and measures up to 2.6 cm. There is mild right hydronephrosis. Moderate gas within the right renal collecting system. Gas within the right ureter which is also slightly dilated proximally. Moderate gas within the urinary bladder. 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 Stomach/Bowel: The stomach is nonenlarged. No dilated small bowel. Diverticular disease of the left colon without acute wall thickening. Vascular/Lymphatic: Aortic atherosclerosis. No enlarged abdominal or pelvic lymph  nodes. Reproductive: Probable small calcified uterine fibroids. No adnexal mass. Foley catheter appears to be looped within the vagina. Other: Negative for pelvic effusion or free air. Musculoskeletal: No acute or suspicious osseous abnormality. IMPRESSION: 1. Foley catheter appears to be looped within the vagina. 2. Moderate gas within the right renal collecting system and right ureter. Moderate gas within the urinary bladder. 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 Gas within the right renal  collecting system and ureter could reflect refluxed gas from the bladder versus gas forming upper urinary tract infection. 3. Multiple large right-sided kidney stones, the largest is seen in the right renal pelvis and measures up to 2.6 cm. There is mild right hydronephrosis. 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 4. Left lower lobe subpleural nodularity measuring 19 x 9 mm. This could be due to scarring versus subpleural nodule, suggest short interim CT follow-up in 3-6 months to reassess 5. Sludge versus small stones in the gallbladder. 6. Diverticular disease of the left colon without acute wall thickening. 7. Aortic atherosclerosis. Critical Value/emergent results were called by telephone at the time of interpretation on 06/05/2024 at 10:22 pm to provider DAN FLOYD , who verbally acknowledged these results. Aortic Atherosclerosis (ICD10-I70.0) and Emphysema (ICD10-J43.9). Electronically Signed   By: Luke Bun M.D.   On: 06/05/2024 22:23   DG Chest 2 View Result Date: 06/05/2024 CLINICAL DATA:  Shortness of breath. EXAM: CHEST - 2 VIEW COMPARISON:  Feb 20, 2024. FINDINGS: Stable cardiomediastinal silhouette. Both lungs are clear. The visualized skeletal structures are unremarkable. IMPRESSION: No active cardiopulmonary disease. Electronically Signed   By: Lynwood Landy Raddle M.D.   On: 06/05/2024 17:00     Scheduled Meds:  feeding supplement  237 mL Oral TID BM   Gerhardt's butt cream   Topical BID   insulin  aspart  0-6 Units Subcutaneous TID WC   leptospermum manuka honey  1 Application Topical Daily   multivitamin with minerals  1 tablet Oral Daily   rosuvastatin   40 mg Oral Daily   Continuous Infusions:  sodium bicarbonate  150 mEq in sterile water  1,150 mL infusion 75 mL/hr at 06/05/24 2111     LOS: 1 day    Time spent:    Sigurd Pac, MD [facility]   06/06/2024, 10:08 AM

## 2024-06-07 DIAGNOSIS — N179 Acute kidney failure, unspecified: Secondary | ICD-10-CM | POA: Diagnosis not present

## 2024-06-07 LAB — BASIC METABOLIC PANEL WITH GFR
Anion gap: 20 — ABNORMAL HIGH (ref 5–15)
BUN: 189 mg/dL — ABNORMAL HIGH (ref 8–23)
CO2: 21 mmol/L — ABNORMAL LOW (ref 22–32)
Calcium: 8.3 mg/dL — ABNORMAL LOW (ref 8.9–10.3)
Chloride: 90 mmol/L — ABNORMAL LOW (ref 98–111)
Creatinine, Ser: 12.64 mg/dL — ABNORMAL HIGH (ref 0.44–1.00)
GFR, Estimated: 3 mL/min — ABNORMAL LOW (ref >60)
Glucose, Bld: 95 mg/dL (ref 70–99)
Potassium: 3.9 mmol/L (ref 3.5–5.1)
Sodium: 131 mmol/L — ABNORMAL LOW (ref 135–145)

## 2024-06-07 LAB — HEMOGLOBIN A1C
Hgb A1c MFr Bld: 6.2 % — ABNORMAL HIGH (ref 4.8–5.6)
Mean Plasma Glucose: 131 mg/dL

## 2024-06-07 LAB — GLUCOSE, CAPILLARY: Glucose-Capillary: 88 mg/dL (ref 70–99)

## 2024-06-07 MED ORDER — GABAPENTIN 100 MG PO CAPS
100.0000 mg | ORAL_CAPSULE | Freq: Two times a day (BID) | ORAL | Status: DC
  Administered 2024-06-07 – 2024-06-09 (×5): 100 mg via ORAL
  Filled 2024-06-07 (×5): qty 1

## 2024-06-07 MED ORDER — SODIUM CHLORIDE 0.9 % IV SOLN: INTRAVENOUS | Status: DC

## 2024-06-07 MED ORDER — CHLORHEXIDINE GLUCONATE CLOTH 2 % EX PADS
6.0000 | MEDICATED_PAD | Freq: Every day | CUTANEOUS | Status: DC
  Administered 2024-06-08 – 2024-06-09 (×2): 6 via TOPICAL

## 2024-06-07 NOTE — Plan of Care (Signed)
  Problem: Pain Managment: Goal: General experience of comfort will improve and/or be controlled Outcome: Progressing

## 2024-06-07 NOTE — Progress Notes (Signed)
 PROGRESS NOTE    Regina Bell  FMW:[id number] DOB: [birth year] DOA: 06/05/2024 PCP: Cicero Aureliano SAUNDERS, MD  83/F with history of CAD, chronic systolic CHF, PE, ascending aortic aneurysm, SVT presented to the ED with worsening bilateral foot pain and numbness for several weeks, in addition reported overall weakness, decreased oral intake, vomiting.  In the ED noted to have significant acute kidney injury with creatinine of 13, BUN 116, nephrology was consulted, transferred to [facility]. - Admitted, started on bicarb infusion, nephrology consulting, CT noted moderate gas within the right renal collecting system and right ureter. 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 Gas within the right renal collecting system and ureter could reflect refluxed gas from the bladder versus gas forming upper urinary tract infection. 3. Multiple large right-sided kidney stones, the largest is seen in the right renal pelvis and measures up to 2.6 cm. There is mild right hydronephrosis. 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 4. Left lower lobe subpleural nodularity measuring 19 x 9 mm.   Subjective: -feels so so, foot pain marginally better  Assessment and Plan:  Acute kidney injury Anion gap metabolic acidosis - Multifactorial, suspect component of cardiorenal along with medications especially Entresto , in addition may be a component of mild right-sided hydronephrosis as well from multiple large renal stones - Foley catheter repositioned, continue IV fluids today -Appreciate urology evaluation, unlikely to have much benefit from [facility], poor surgical candidate as well -Discussed with patient regarding home with hospice services over next 1 to 2 days should she not have significant improvement  Chronic combined systolic and diastolic CHF Last echo 5/25 with EF 25-30%, grade 3 DD, moderately reduced RV Diuretics on hold, Entresto   discontinued, see discussion above - Continue IV fluids for today  Peripheral neuropathy -A1c 6.2, B12 normal - Add gabapentin  at low-dose  Normocytic anemia  Multiple renal calculi - See discussion above   DVT prophylaxis: SCDs Code Status: DNR Family Communication: No family at bedside, will call and update daughter Disposition Plan: Home eventually  Objective: Vitals:   06/06/24 1935 06/07/24 0034 06/07/24 0321 06/07/24 0808  BP: (!) 156/55 (!) 129/52 (!) 133/55 (!) 140/52  Pulse: 70 69 77 67  Resp: 18 18 20 15   Temp: 98.1 F (36.7 C) 98 F (36.7 C) 98 F (36.7 C) 97.6 F (36.4 C)  TempSrc: Oral Oral Oral Oral  SpO2:  97%  97%  Weight:   77 kg   Height:        Intake/Output Summary (Last 24 hours) at 06/07/2024 1053 Last data filed at 06/07/2024 0900 Gross per 24 hour  Intake 279.82 ml  Output 2150 ml  Net -1870.18 ml   Filed Weights   06/05/24 2320 06/06/24 0317 06/07/24 0321  Weight: 71.5 kg 71.7 kg 77 kg    Examination:  General exam: Chronically ill pleasant eyes closed, oriented x 2, no distress HEENT: No JVD CVS: S1-S2, regular rhythm Lungs: Decreased breath sounds at the bases Abdomen: Soft, nontender, bowel sounds present EXTR: No edema Skin: No rashes on exposed skin    Data Reviewed:   CBC: Recent Labs  Lab 06/05/24 1747 06/06/24 0245  WBC 13.0* 9.9  NEUTROABS 11.9* 8.4*  HGB 12.2 11.1*  HCT 38.2 32.8*  MCV 92.0 87.7  PLT 232 218   Basic Metabolic Panel: Recent Labs  Lab 06/05/24 1747 06/05/24 2216 06/06/24 0245 06/06/24 0801 06/07/24 0257  NA 132*  --  130* 133* 131*  K  5.0  --  4.7 4.5 3.9  CL 91*  --  91* 92* 90*  CO2 13*  --  16* 17* 21*  GLUCOSE 134*  --  99 83 95  BUN 159*  --  187* 191* 189*  CREATININE 13.40*  --  13.24* 13.30* 12.64*  CALCIUM  9.9  --  8.8* 8.9 8.3*  MG  --  2.6* 2.3  --   --   PHOS  --  9.0* 9.2*  --   --    GFR: Estimated Creatinine Clearance: 3.5 mL/min (A) (by C-G formula based on SCr  of 12.64 mg/dL (H)). Liver Function Tests: Recent Labs  Lab 06/05/24 1747 06/06/24 0245  AST 19 19  ALT 16 18  ALKPHOS 58 37*  BILITOT 0.7 0.8  PROT 7.7 6.5  ALBUMIN 3.6 2.6*   Recent Labs  Lab 06/05/24 1747  LIPASE 72*   No results for input(s): AMMONIA in the last 168 hours. Coagulation Profile: Recent Labs  Lab 06/05/24 2216  INR 1.1   Cardiac Enzymes: Recent Labs  Lab 06/05/24 2216  CKTOTAL 25*   BNP (last 3 results) Recent Labs    02/19/24 0933 06/05/24 1747  PROBNP >35,000.0* 34,718.0*   HbA1C: Recent Labs    06/06/24 1106  HGBA1C 6.2*   CBG: Recent Labs  Lab 06/06/24 0649 06/06/24 1105 06/06/24 1526 06/06/24 2125 06/07/24 0550  GLUCAP 78 121* 110* 85 88   Lipid Profile: No results for input(s): CHOL, HDL, LDLCALC, TRIG, CHOLHDL, LDLDIRECT in the last 72 hours. Thyroid  Function Tests: No results for input(s): TSH, T4TOTAL, FREET4, T3FREE, THYROIDAB in the last 72 hours. Anemia Panel: Recent Labs    06/06/24 1106  VITAMINB12 428   Urine analysis:    Component Value Date/Time   COLORURINE YELLOW 06/06/2024 1220   APPEARANCEUR TURBID (A) 06/06/2024 1220   LABSPEC 1.010 06/06/2024 1220   PHURINE 5.0 06/06/2024 1220   GLUCOSEU 150 (A) 06/06/2024 1220   HGBUR LARGE (A) 06/06/2024 1220   BILIRUBINUR NEGATIVE 06/06/2024 1220   KETONESUR NEGATIVE 06/06/2024 1220   PROTEINUR 100 (A) 06/06/2024 1220   NITRITE NEGATIVE 06/06/2024 1220   LEUKOCYTESUR MODERATE (A) 06/06/2024 1220   Sepsis Labs: @LABRCNTIP (procalcitonin:4,lacticidven:4)  )No results found for this or any previous visit (from the past 240 hours).   Radiology Studies: CT ABDOMEN PELVIS WO CONTRAST Result Date: 06/05/2024 CLINICAL DATA:  Bilateral foot pain numbness tingling leukocytosis and acute kidney injury per epic notes EXAM: CT ABDOMEN AND PELVIS WITHOUT CONTRAST TECHNIQUE: Multidetector CT imaging of the abdomen and pelvis was performed  following the standard protocol without IV contrast. RADIATION DOSE REDUCTION: This exam was performed according to the departmental dose-optimization program which includes automated exposure control, adjustment of the mA and/or kV according to patient size and/or use of iterative reconstruction technique. COMPARISON:  Kidney ultrasound 02/20/2024, CT 02/12/2017, chest CT 02/19/2024 FINDINGS: Lower chest: Lung bases demonstrate bandlike atelectasis or scarring at the bases. Left lower lobe subpleural nodularity measuring 19 x 9 mm on series 2, image 23. Cardiomegaly. Small hiatal hernia. Dense mitral calcifications. Coronary vascular calcification. Hepatobiliary: Sludge versus small stones in the gallbladder. No biliary dilatation Pancreas: Unremarkable. No pancreatic ductal dilatation or surrounding inflammatory changes. Spleen: Normal in size without focal abnormality. Adrenals/Urinary Tract: Adrenal glands are normal. 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 No left hydronephrosis. Multiple large right-sided kidney stones, the largest is seen in the right renal pelvis and measures up to 2.6 cm. There is mild right hydronephrosis. Moderate gas  within the right renal collecting system. Gas within the right ureter which is also slightly dilated proximally. Moderate gas within the urinary bladder. 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 Stomach/Bowel: The stomach is nonenlarged. No dilated small bowel. Diverticular disease of the left colon without acute wall thickening. Vascular/Lymphatic: Aortic atherosclerosis. No enlarged abdominal or pelvic lymph nodes. Reproductive: Probable small calcified uterine fibroids. No adnexal mass. Foley catheter appears to be looped within the vagina. Other: Negative for pelvic effusion or free air. Musculoskeletal: No acute or suspicious osseous abnormality. IMPRESSION: 1. Foley catheter appears to be  looped within the vagina. 2. Moderate gas within the right renal collecting system and right ureter. Moderate gas within the urinary bladder. 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 Gas within the right renal collecting system and ureter could reflect refluxed gas from the bladder versus gas forming upper urinary tract infection. 3. Multiple large right-sided kidney stones, the largest is seen in the right renal pelvis and measures up to 2.6 cm. There is mild right hydronephrosis. 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 4. Left lower lobe subpleural nodularity measuring 19 x 9 mm. This could be due to scarring versus subpleural nodule, suggest short interim CT follow-up in 3-6 months to reassess 5. Sludge versus small stones in the gallbladder. 6. Diverticular disease of the left colon without acute wall thickening. 7. Aortic atherosclerosis. Critical Value/emergent results were called by telephone at the time of interpretation on 06/05/2024 at 10:22 pm to provider DAN FLOYD , who verbally acknowledged these results. Aortic Atherosclerosis (ICD10-I70.0) and Emphysema (ICD10-J43.9). Electronically Signed   By: Luke Bun M.D.   On: 06/05/2024 22:23   DG Chest 2 View Result Date: 06/05/2024 CLINICAL DATA:  Shortness of breath. EXAM: CHEST - 2 VIEW COMPARISON:  Feb 20, 2024. FINDINGS: Stable cardiomediastinal silhouette. Both lungs are clear. The visualized skeletal structures are unremarkable. IMPRESSION: No active cardiopulmonary disease. Electronically Signed   By: Lynwood Landy Raddle M.D.   On: 06/05/2024 17:00     Scheduled Meds:  feeding supplement  237 mL Oral BID BM   gabapentin   100 mg Oral BID   Gerhardt's butt cream   Topical BID   leptospermum manuka honey  1 Application Topical Daily   rosuvastatin   40 mg Oral Daily   Continuous Infusions:  sodium chloride  50 mL/hr at 06/07/24 0954    cefTRIAXone  (ROCEPHIN )  IV Stopped (06/06/24 1257)     LOS: 2 days    Time spent:    Sigurd Pac, MD [facility]   06/07/2024, 10:53 AM

## 2024-06-08 DIAGNOSIS — N179 Acute kidney failure, unspecified: Secondary | ICD-10-CM | POA: Diagnosis not present

## 2024-06-08 DIAGNOSIS — Z515 Encounter for palliative care: Secondary | ICD-10-CM | POA: Diagnosis not present

## 2024-06-08 DIAGNOSIS — Z7189 Other specified counseling: Secondary | ICD-10-CM | POA: Diagnosis not present

## 2024-06-08 LAB — BASIC METABOLIC PANEL WITH GFR
Anion gap: 17 — ABNORMAL HIGH (ref 5–15)
BUN: 179 mg/dL — ABNORMAL HIGH (ref 8–23)
CO2: 22 mmol/L (ref 22–32)
Calcium: 7.9 mg/dL — ABNORMAL LOW (ref 8.9–10.3)
Chloride: 92 mmol/L — ABNORMAL LOW (ref 98–111)
Creatinine, Ser: 12.1 mg/dL — ABNORMAL HIGH (ref 0.44–1.00)
GFR, Estimated: 3 mL/min — ABNORMAL LOW (ref >60)
Glucose, Bld: 79 mg/dL (ref 70–99)
Potassium: 3.8 mmol/L (ref 3.5–5.1)
Sodium: 131 mmol/L — ABNORMAL LOW (ref 135–145)

## 2024-06-08 MED ORDER — HYDROMORPHONE HCL 1 MG/ML PO LIQD: 1.0000 mg | ORAL | 0 refills | Status: DC | PRN

## 2024-06-08 NOTE — Discharge Summary (Addendum)
 Physician Discharge Summary  Regina Bell:[id number] DOB: December 08, 1940 DOA: 06/05/2024  PCP: Cicero Aureliano SAUNDERS, MD  Admit date: 06/05/2024 Discharge date: 06/08/2024  Time spent: 45 minutes  Recommendations for Outpatient Follow-up:  Home with hospice for comfort focused care   Discharge Diagnoses:  Principal Problem:   AKI (acute kidney injury) (HCC) Active Problems:   Elevated troponin   HFrEF (heart failure with reduced ejection fraction) (HCC)   History of pulmonary embolus (PE)   Sacral decubitus ulcer, stage II (HCC)   Type 2 diabetes mellitus with chronic kidney disease, without long-term current use of insulin  (HCC)   Pain in both feet   Discharge Condition: Poor  Diet recommendation: Comfort feeds  Filed Weights   06/05/24 2320 06/06/24 0317 06/07/24 0321  Weight: 71.5 kg 71.7 kg 77 kg    History of present illness:  83/F with history of CAD, chronic systolic CHF, PE, ascending aortic aneurysm, SVT presented to the ED with worsening bilateral foot pain and numbness for several weeks, in addition reported overall weakness, decreased oral intake, vomiting.  In the ED noted to have significant acute kidney injury with creatinine of 13, BUN 116, nephrology was consulted, transferred to [facility]. - Admitted, started on bicarb infusion, nephrology consulting, CT noted moderate gas within the right renal collecting system and right ureter. 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 Gas within the right renal collecting system and ureter could reflect refluxed gas from the bladder versus gas forming upper urinary tract infection. 3. Multiple large right-sided kidney stones, the largest is seen in the right renal pelvis and measures up to 2.6 cm. There is mild right hydronephrosis. 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 4. Left lower lobe subpleural nodularity measuring 19 x 9 mm.   Hospital Course:    Acute kidney injury Anion gap metabolic acidosis - Multifactorial, suspect component of cardiorenal along with medications including Entresto , in addition may be a component of mild right-sided hydronephrosis as well from multiple large renal stones - Foley catheter placed and then repositioned, hydrated with IV fluids -Followed by nephrology, poor prognosis considering no improvement in kidney function, patient declined dialysis -Appreciate urology evaluation, felt to be unlikely to have much benefit from [facility], poor surgical candidate as well - Seen by palliative care, plan for discharge home with hospice services for comfort focused care   Chronic combined systolic and diastolic CHF Last echo 5/25 with EF 25-30%, grade 3 DD, moderately reduced RV Diuretics on hold, Entresto  discontinued, see discussion above - Hydrated with IV fluids for few days, remained euvolemic   Peripheral neuropathy -A1c 6.2, B12 normal - Use low-dose gabapentin  in the hospital   Normocytic anemia   Multiple renal calculi - See discussion above    Consultations: Nephrology, urology, palliative care  Discharge Exam: Vitals:   06/08/24 0436 06/08/24 0725  BP: (!) 140/54 (!) 140/53  Pulse:  60  Resp: 16 17  Temp: 97.8 F (36.6 C) 97.7 F (36.5 C)  SpO2: 96% 96%   General exam: Chronically ill pleasant eyes closed, oriented x 2, no distress HEENT: No JVD CVS: S1-S2, regular rhythm Lungs: Decreased breath sounds at the bases Abdomen: Soft, nontender, bowel sounds present EXTR: No edema Skin: No rashes on exposed skin    Discharge Instructions   Discharge Instructions     Discharge instructions   Complete by: As directed    Comfort feeds and comfort focused care   Discharge wound care:  Complete by: As directed    routine   Increase activity slowly   Complete by: As directed       Allergies as of 06/08/2024   No Known Allergies      Medication List     STOP taking these  medications    empagliflozin  10 MG Tabs tablet Commonly known as: Jardiance    Entresto  24-26 MG Generic drug: sacubitril -valsartan    metoprolol  succinate 25 MG 24 hr tablet Commonly known as: TOPROL -XL   OVER THE COUNTER MEDICATION   rosuvastatin  40 MG tablet Commonly known as: CRESTOR    sertraline  25 MG tablet Commonly known as: ZOLOFT        TAKE these medications    aspirin  EC 81 MG tablet Take 1 tablet (81 mg total) by mouth daily. Swallow whole.   furosemide  40 MG tablet Commonly known as: LASIX  Take 1 tablet (40 mg total) by mouth daily as needed (for weight gain of 3 pounds).   HYDROmorphone  HCl 1 MG/ML Liqd Commonly known as: Dilaudid  Take 1 mL (1 mg total) by mouth every 4 (four) hours as needed (for pain, air hunger).   UNKNOWN TO PATIENT Place 1 drop into the left eye See admin instructions. Unnamed eye drops (name not recalled by patient and [facility] did not have on file/they were called): Instill 1 drop into the affected left eye in the morning, afternoon, and evening for tearing and irritation               Discharge Care Instructions  (From admission, onward)           Start     Ordered   06/08/24 0000  Discharge wound care:       Comments: routine   06/08/24 1033           No Known Allergies    The results of significant diagnostics from this hospitalization (including imaging, microbiology, ancillary and laboratory) are listed below for reference.    Significant Diagnostic Studies: CT ABDOMEN PELVIS WO CONTRAST Result Date: 06/05/2024 CLINICAL DATA:  Bilateral foot pain numbness tingling leukocytosis and acute kidney injury per epic notes EXAM: CT ABDOMEN AND PELVIS WITHOUT CONTRAST TECHNIQUE: Multidetector CT imaging of the abdomen and pelvis was performed following the standard protocol without IV contrast. RADIATION DOSE REDUCTION: This exam was performed according to the departmental dose-optimization program which includes  automated exposure control, adjustment of the mA and/or kV according to patient size and/or use of iterative reconstruction technique. COMPARISON:  Kidney ultrasound 02/20/2024, CT 02/12/2017, chest CT 02/19/2024 FINDINGS: Lower chest: Lung bases demonstrate bandlike atelectasis or scarring at the bases. Left lower lobe subpleural nodularity measuring 19 x 9 mm on series 2, image 23. Cardiomegaly. Small hiatal hernia. Dense mitral calcifications. Coronary vascular calcification. Hepatobiliary: Sludge versus small stones in the gallbladder. No biliary dilatation Pancreas: Unremarkable. No pancreatic ductal dilatation or surrounding inflammatory changes. Spleen: Normal in size without focal abnormality. Adrenals/Urinary Tract: Adrenal glands are normal. 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 No left hydronephrosis. Multiple large right-sided kidney stones, the largest is seen in the right renal pelvis and measures up to 2.6 cm. There is mild right hydronephrosis. Moderate gas within the right renal collecting system. Gas within the right ureter which is also slightly dilated proximally. Moderate gas within the urinary bladder. 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 Stomach/Bowel: The stomach is nonenlarged. No dilated small bowel. Diverticular disease of the left colon without acute wall  thickening. Vascular/Lymphatic: Aortic atherosclerosis. No enlarged abdominal or pelvic lymph nodes. Reproductive: Probable small calcified uterine fibroids. No adnexal mass. Foley catheter appears to be looped within the vagina. Other: Negative for pelvic effusion or free air. Musculoskeletal: No acute or suspicious osseous abnormality. IMPRESSION: 1. Foley catheter appears to be looped within the vagina. 2. Moderate gas within the right renal collecting system and right ureter. Moderate gas within the urinary bladder. 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 Gas within the right renal collecting system and ureter could reflect refluxed gas from the bladder versus gas forming upper urinary tract infection. 3. Multiple large right-sided kidney stones, the largest is seen in the right renal pelvis and measures up to 2.6 cm. There is mild right hydronephrosis. 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 4. Left lower lobe subpleural nodularity measuring 19 x 9 mm. This could be due to scarring versus subpleural nodule, suggest short interim CT follow-up in 3-6 months to reassess 5. Sludge versus small stones in the gallbladder. 6. Diverticular disease of the left colon without acute wall thickening. 7. Aortic atherosclerosis. Critical Value/emergent results were called by telephone at the time of interpretation on 06/05/2024 at 10:22 pm to provider DAN FLOYD , who verbally acknowledged these results. Aortic Atherosclerosis (ICD10-I70.0) and Emphysema (ICD10-J43.9). Electronically Signed   By: Luke Bun M.D.   On: 06/05/2024 22:23   DG Chest 2 View Result Date: 06/05/2024 CLINICAL DATA:  Shortness of breath. EXAM: CHEST - 2 VIEW COMPARISON:  Feb 20, 2024. FINDINGS: Stable cardiomediastinal silhouette. Both lungs are clear. The visualized skeletal structures are unremarkable. IMPRESSION: No active cardiopulmonary disease. Electronically Signed   By: Lynwood Landy Raddle M.D.   On: 06/05/2024 17:00    Microbiology: Recent Results (from the past 240 hours)  Urine Culture (for pregnant, neutropenic or urologic patients or patients with an indwelling urinary catheter)     Status: Abnormal (Preliminary result)   Collection Time: 06/06/24 11:36 AM   Specimen: Urine, Clean Catch  Result Value Ref Range Status   Specimen Description URINE, CLEAN CATCH  Final   Special Requests NONE  Final   Culture (A)  Final    >=100,000 COLONIES/mL ESCHERICHIA  COLI SUSCEPTIBILITIES TO FOLLOW Performed at [facility], [address], [place], [place] [address]    Report Status PENDING  Incomplete     Labs: Basic Metabolic Panel: Recent Labs  Lab 06/05/24 1747 06/05/24 2216 06/06/24 0245 06/06/24 0801 06/07/24 0257 06/08/24 0255  NA 132*  --  130* 133* 131* 131*  K 5.0  --  4.7 4.5 3.9 3.8  CL 91*  --  91* 92* 90* 92*  CO2 13*  --  16* 17* 21* 22  GLUCOSE 134*  --  99 83 95 79  BUN 159*  --  187* 191* 189* 179*  CREATININE 13.40*  --  13.24* 13.30* 12.64* 12.10*  CALCIUM  9.9  --  8.8* 8.9 8.3* 7.9*  MG  --  2.6* 2.3  --   --   --   PHOS  --  9.0* 9.2*  --   --   --    Liver Function Tests: Recent Labs  Lab 06/05/24 1747 06/06/24 0245  AST 19 19  ALT 16 18  ALKPHOS 58 37*  BILITOT 0.7 0.8  PROT 7.7 6.5  ALBUMIN 3.6 2.6*   Recent Labs  Lab 06/05/24 1747  LIPASE 72*  No results for input(s): AMMONIA in the last 168 hours. CBC: Recent Labs  Lab 06/05/24 1747 06/06/24 0245  WBC 13.0* 9.9  NEUTROABS 11.9* 8.4*  HGB 12.2 11.1*  HCT 38.2 32.8*  MCV 92.0 87.7  PLT 232 218   Cardiac Enzymes: Recent Labs  Lab 06/05/24 2216  CKTOTAL 25*   BNP: BNP (last 3 results) Recent Labs    03/17/24 1546  BNP 1,471.2*    ProBNP (last 3 results) Recent Labs    02/19/24 0933 06/05/24 1747  PROBNP >35,000.0* 34,718.0*    CBG: Recent Labs  Lab 06/06/24 0649 06/06/24 1105 06/06/24 1526 06/06/24 2125 06/07/24 0550  GLUCAP 78 121* 110* 85 88       Signed:  Sigurd Pac MD.  [facility] 06/08/2024, 10:33 AM

## 2024-06-08 NOTE — Plan of Care (Signed)
   Problem: Coping: Goal: Ability to adjust to condition or change in health will improve Outcome: Progressing   Problem: Fluid Volume: Goal: Ability to maintain a balanced intake and output will improve Outcome: Progressing   Problem: Health Behavior/Discharge Planning: Goal: Ability to identify and utilize available resources and services will improve Outcome: Progressing

## 2024-06-08 NOTE — Progress Notes (Signed)
 Daily Progress Note   Patient Name: Regina Bell       Date: 06/08/2024 DOB: 1940-12-14  Age: 83 y.o. MRN#: [id number] Attending Physician: Fairy Frames, MD Primary Care Physician: Cicero Aureliano SAUNDERS, MD Admit Date: 06/05/2024  Reason for Consultation/Follow-up: Establishing goals of care   Length of Stay: 3  Current Medications: Scheduled Meds:   Chlorhexidine  Gluconate Cloth  6 each Topical Daily   feeding supplement  237 mL Oral BID BM   gabapentin   100 mg Oral BID   Gerhardt's butt cream   Topical BID   leptospermum manuka honey  1 Application Topical Daily   rosuvastatin   40 mg Oral Daily    Continuous Infusions:  cefTRIAXone  (ROCEPHIN )  IV 1 g (06/07/24 1303)    PRN Meds: acetaminophen  **OR** acetaminophen , fentaNYL  (SUBLIMAZE ) injection, ondansetron  **OR** ondansetron  (ZOFRAN ) IV  Physical Exam Vitals reviewed.  Constitutional:      General: She is not in acute distress. HENT:     Head: Atraumatic.  Cardiovascular:     Rate and Rhythm: Normal rate.  Pulmonary:     Effort: Pulmonary effort is normal.  Skin:    General: Skin is warm and dry.  Neurological:     Mental Status: She is alert and oriented to person, place, and time.  Psychiatric:        Mood and Affect: Mood normal.        Behavior: Behavior normal.        Thought Content: Thought content normal.             Vital Signs: BP (!) 140/53 (BP Location: Left Arm)   Pulse 60   Temp 97.7 F (36.5 C) (Oral)   Resp 17   Ht 5' 5 (1.651 m)   Wt 77 kg   SpO2 96%   BMI 28.25 kg/m  SpO2: SpO2: 96 % O2 Device: O2 Device: Room Air O2 Flow Rate:      Patient Active Problem List   Diagnosis Date Noted   AKI (acute kidney injury) (HCC) 06/05/2024   History of pulmonary embolus (PE) 06/05/2024    Sacral decubitus ulcer, stage II (HCC) 06/05/2024   Type 2 diabetes mellitus with chronic kidney disease, without long-term current use of insulin  (HCC) 06/05/2024   Pain in both feet 06/05/2024   HFrEF (heart  failure with reduced ejection fraction) (HCC) 02/22/2024   SVT (supraventricular tachycardia) (HCC) 02/22/2024   Elevated serum creatinine 02/21/2024   Elevated troponin 02/21/2024   CHF exacerbation (HCC) 02/20/2024   Acute respiratory failure with hypoxia (HCC) 02/19/2024   UTI (urinary tract infection) 02/19/2024   Pleural effusion due to CHF (congestive heart failure) (HCC) 02/19/2024   Acute CHF (HCC) 02/19/2024   Hematuria 01/27/2017   Pulmonary embolism (HCC) 01/26/2017    Palliative Care Assessment & Plan   Patient Profile: 83 y.o. female  with past medical history of CAD, chronic systolic CHF, PE, ascending aortic aneurysm, SVT presented to the ED with worsening bilateral foot pain and numbness for several weeks, in addition reported overall weakness, decreased oral intake, vomiting.  She was admitted on 06/05/2024 with severe AKI, AGMA, chronic combined systolic/diastolic CHF, peripheral neuropathy, and others.    Palliative medicine was consulted for GOC conversations.  Today's Discussion: Reviewed chart and received update from attending provider. Patient sitting up in bed in no apparent distress. No family at bedside. Reviewed role of PMT with patient. We discussed the current hospitalization and that there had been no significant improvement in kidney function. Patient states she feels comfortable discharging home with hospice. Briefly reviewed hospice philosophy. Patient does not have any questions or concerns around discharge. She gives permission for me to call her daughter [name].  Spoke to patient's daughter [name] by phone. Discussed the plan to discharge home with hospice. Discussed hospice philosophy. [name] has been communicating with hospice liaison. The  plan is for family to line up previous caregivers to assist with care at discharge.   Encouraged patient and family to call PMT with questions or concerns before discharge.   Recommendations/Plan: DNR/DNI Treat the treatable No hemodialysis Discharge home with  home hospice- [facility] liaison aware Continued PMT support   Code Status:    Code Status Orders  (From admission, onward)           Start     Ordered   06/05/24 2024  Do not attempt resuscitation (DNR)- Limited -Do Not Intubate (DNI)  Continuous       Question Answer Comment  If pulseless and not breathing No CPR or chest compressions.   In Pre-Arrest Conditions (Patient Is Breathing and Has A Pulse) Do not intubate. Provide all appropriate non-invasive medical interventions. Avoid ICU transfer unless indicated or required.   Consent: Discussion documented in EHR or advanced directives reviewed      06/05/24 2025         Extensive chart review has been completed prior to seeing the patient including labs, vital signs, imaging, progress/consult notes, orders, medications, and available advance directive documents.   Care plan was discussed with bedside RN and Dr. Fairy  Time spent: 50 minutes  Thank you for allowing the Palliative Medicine Team to assist in the care of this patient.    Stephane CHRISTELLA Palin, NP  Please contact Palliative Medicine Team phone at [phone] for questions and concerns.

## 2024-06-08 NOTE — TOC Initial Note (Signed)
 Transition of Care [facility]) - Initial/Assessment Note    Patient Details  Name: Regina Bell MRN: [id number] Date of Birth: 1941-04-07  [facility] CM/SW Contact:    Marval Gell, RN Phone Number: 06/08/2024, 11:08 AM  Clinical Narrative:                  Notified by Stephane, PMT, that plan is for patient to go home with home hospice tomorrow after daughter sets up [facility] home care. Nat [facility] was also in this message and she has been in discussion with her since yesterday for transportation. ICM will continue to follow   Expected Discharge Plan: Home w Hospice Care Barriers to Discharge: Continued Medical Work up   Patient Goals and CMS Choice            Expected Discharge Plan and Services         Expected Discharge Date: 06/08/24                           [facility] Agency: [facility] Date [facility] Agency Contacted: 06/08/24 Time HH Agency Contacted: 1108 Representative spoke with at [facility] Agency: Nat  Prior Living Arrangements/Services                       Activities of Daily Living   ADL Screening (condition at time of admission) Independently performs ADLs?: No Does the patient have a NEW difficulty with bathing/dressing/toileting/self-feeding that is expected to last >3 days?: Yes (Initiates electronic notice to provider for possible OT consult) Does the patient have a NEW difficulty with getting in/out of bed, walking, or climbing stairs that is expected to last >3 days?: Yes (Initiates electronic notice to provider for possible PT consult) Does the patient have a NEW difficulty with communication that is expected to last >3 days?: Yes (Initiates electronic notice to provider for possible SLP consult) Is the patient deaf or have difficulty hearing?: No Does the patient have difficulty seeing, even when wearing glasses/contacts?: No Does the patient have difficulty concentrating, remembering, or making decisions?:  No  Permission Sought/Granted                  Emotional Assessment              Admission diagnosis:  Troponin level elevated [R79.89] AKI (acute kidney injury) (HCC) [N17.9] Foot pain, bilateral [F20.328, M79.672] Patient Active Problem List   Diagnosis Date Noted   AKI (acute kidney injury) (HCC) 06/05/2024   History of pulmonary embolus (PE) 06/05/2024   Sacral decubitus ulcer, stage II (HCC) 06/05/2024   Type 2 diabetes mellitus with chronic kidney disease, without long-term current use of insulin  (HCC) 06/05/2024   Pain in both feet 06/05/2024   HFrEF (heart failure with reduced ejection fraction) (HCC) 02/22/2024   SVT (supraventricular tachycardia) (HCC) 02/22/2024   Elevated serum creatinine 02/21/2024   Elevated troponin 02/21/2024   CHF exacerbation (HCC) 02/20/2024   Acute respiratory failure with hypoxia (HCC) 02/19/2024   UTI (urinary tract infection) 02/19/2024   Pleural effusion due to CHF (congestive heart failure) (HCC) 02/19/2024   Acute CHF (HCC) 02/19/2024   Hematuria 01/27/2017   Pulmonary embolism (HCC) 01/26/2017   PCP:  Cicero Aureliano SAUNDERS, MD Pharmacy:   [facility] [phone] [address] [place], [place] - [address] [place] [address] Phone: [phone] Fax: [phone]     Social Drivers of  Health (SDOH) Social History: SDOH Screenings   Food Insecurity: No Food Insecurity (06/06/2024)  Housing: Low Risk  (06/06/2024)  Transportation Needs: No Transportation Needs (06/06/2024)  Utilities: Not At Risk (06/06/2024)  Financial Resource Strain: Low Risk  (02/21/2024)  Social Connections: Moderately Integrated (06/06/2024)  Tobacco Use: Medium Risk (03/17/2024)   SDOH Interventions:     Readmission Risk Interventions     No data to display

## 2024-06-08 NOTE — Progress Notes (Signed)
 [facility] [phone] [facility] Liaison Note   Received request from [facility] for hospice services at home after discharge. Spoke with daughter [name] outside of room to initiate education related to hospice philosophy, services and team approach to care. Patent and caregiver verbalized understanding of information given. Per discussion, the plan is for discharge home tomorrow via [facility].   DME needs discussed. Patient has the following equipment in the home: walker and recliner   Family requests the following equipment for delivery: none at this time. Will also have AV nurse assess if anything could be beneficial.  Please send signed and completed DNR home with patient/family. Please provide prescriptions at discharge as needed to ensure ongoing symptom management.   [facility] information and contact numbers given to [employer]. Please call with any concerns.   Thank you for the opportunity to participate in this patient's care.    Nat Babe, BSN, RN [employer] [phone]

## 2024-06-09 ENCOUNTER — Other Ambulatory Visit: Payer: Self-pay | Admitting: Internal Medicine

## 2024-06-09 ENCOUNTER — Inpatient Hospital Stay (HOSPITAL_COMMUNITY)

## 2024-06-09 DIAGNOSIS — Z515 Encounter for palliative care: Secondary | ICD-10-CM | POA: Insufficient documentation

## 2024-06-09 DIAGNOSIS — I5021 Acute systolic (congestive) heart failure: Secondary | ICD-10-CM

## 2024-06-09 LAB — ECHOCARDIOGRAM COMPLETE
AR max vel: 2.88 cm2
AV Peak grad: 7.7 mmHg
Ao pk vel: 1.39 m/s
Area-P 1/2: 4.49 cm2
Calc EF: 35.2 %
Height: 65 in
MV VTI: 2.52 cm2
S' Lateral: 3.6 cm
Single Plane A2C EF: 31.4 %
Single Plane A4C EF: 34.9 %
Weight: 2557.34 [oz_av]

## 2024-06-09 LAB — URINE CULTURE: Culture: >=100000 — AB

## 2024-06-09 LAB — BASIC METABOLIC PANEL WITH GFR
Anion gap: 19 — ABNORMAL HIGH (ref 5–15)
BUN: 165 mg/dL — ABNORMAL HIGH (ref 8–23)
CO2: 23 mmol/L (ref 22–32)
Calcium: 8.3 mg/dL — ABNORMAL LOW (ref 8.9–10.3)
Chloride: 94 mmol/L — ABNORMAL LOW (ref 98–111)
Creatinine, Ser: 11.82 mg/dL — ABNORMAL HIGH (ref 0.44–1.00)
GFR, Estimated: 3 mL/min — ABNORMAL LOW (ref >60)
Glucose, Bld: 87 mg/dL (ref 70–99)
Potassium: 3.8 mmol/L (ref 3.5–5.1)
Sodium: 136 mmol/L (ref 135–145)

## 2024-06-09 MED ORDER — ONDANSETRON 4 MG PO TBDP: 4.0000 mg | ORAL_TABLET | Freq: Three times a day (TID) | ORAL | 0 refills | Status: AC | PRN

## 2024-06-09 MED ORDER — MORPHINE SULFATE (CONCENTRATE) 10 MG /0.5 ML PO SOLN: 20.0000 mg | ORAL | 0 refills | Status: AC | PRN

## 2024-06-09 NOTE — Progress Notes (Signed)
 Echocardiogram 2D Echocardiogram has been performed.  Thea Norlander 06/09/2024, 9:54 AM

## 2024-06-09 NOTE — Progress Notes (Signed)
 Physical Therapy Treatment Patient Details Name: Regina Bell MRN: [id number] DOB: 1941/07/27 Today's Date: 06/09/2024   History of Present Illness Pt is a 83 y.o female admitted 06/05/24 for LB pain and AKI. PMH: CAD, CHF, PE, DM ascending aortic aneurysm, SVT    PT Comments  Pt pleasant, alert and able to pivot to EOB without assist with HOB elevated. Pt continues to demonstrate Rt foot drop with cues for safety and sequence with stepping as well as assist needed to power up to standing. Family present throughout session and state no stairs, 24hr care at D/C and aware of foot drop and deficits. Encouraged continued mobility and OOB to chair for meals. Pt denied further activity at this time with plan for home with hospice.    If plan is discharge home, recommend the following: A little help with walking and/or transfers;A little help with bathing/dressing/bathroom;Assistance with cooking/housework;Assist for transportation;Direct supervision/assist for financial management;Supervision due to cognitive status;Help with stairs or ramp for entrance   Can travel by private vehicle        Equipment Recommendations  None recommended by PT    Recommendations for Other Services       Precautions / Restrictions Precautions Precautions: Fall Recall of Precautions/Restrictions: Impaired Precaution/Restrictions Comments: Rt foot drop     Mobility  Bed Mobility Overal bed mobility: Modified Independent             General bed mobility comments: pt able to pivot to EOB without assist with HOB 35 degrees and use of rail    Transfers Overall transfer level: Needs assistance   Transfers: Sit to/from Stand Sit to Stand: Min assist           General transfer comment: min assist to rise from bed with cues for hand placement and physical assist for power up and anterior translation    Ambulation/Gait Ambulation/Gait assistance: Contact guard assist Gait Distance (Feet): 100  Feet Assistive device: Rolling walker (2 wheels) Gait Pattern/deviations: Step-through pattern, Decreased stride length, Decreased dorsiflexion - right, Trunk flexed, Steppage   Gait velocity interpretation: <1.8 ft/sec, indicate of risk for recurrent falls   General Gait Details: mod cues for posture and proximity to RW. Pt with Rt foot drop, reports no brace and that it started 3 weeks ago, steppage pattern on RLE   Stairs             Wheelchair Mobility     Tilt Bed    Modified Rankin (Stroke Patients Only)       Balance Overall balance assessment: Needs assistance Sitting-balance support: Feet supported, No upper extremity supported Sitting balance-Leahy Scale: Fair     Standing balance support: Bilateral upper extremity supported, Reliant on assistive device for balance, During functional activity Standing balance-Leahy Scale: Poor Standing balance comment: Reliant on RW                            Communication Communication Communication: No apparent difficulties  Cognition Arousal: Alert Behavior During Therapy: Flat affect   PT - Cognitive impairments: Orientation, Memory, Safety/Judgement, Problem solving                         Following commands: Intact Following commands impaired: Only follows one step commands consistently    Cueing Cueing Techniques: Verbal cues  Exercises      General Comments        Pertinent Vitals/Pain Pain Assessment Pain  Assessment: No/denies pain    Home Living                          Prior Function            PT Goals (current goals can now be found in the care plan section) Progress towards PT goals: Progressing toward goals    Frequency    Min 2X/week      PT Plan      Co-evaluation              AM-PAC PT 6 Clicks Mobility   Outcome Measure  Help needed turning from your back to your side while in a flat bed without using bedrails?: None Help needed  moving from lying on your back to sitting on the side of a flat bed without using bedrails?: A Little Help needed moving to and from a bed to a chair (including a wheelchair)?: A Little Help needed standing up from a chair using your arms (e.g., wheelchair or bedside chair)?: A Little Help needed to walk in hospital room?: A Little Help needed climbing 3-5 steps with a railing? : A Lot 6 Click Score: 18    End of Session Equipment Utilized During Treatment: Gait belt Activity Tolerance: Patient tolerated treatment well Patient left: in chair;with call bell/phone within reach;with chair alarm set;with family/visitor present Nurse Communication: Mobility status PT Visit Diagnosis: Other abnormalities of gait and mobility (R26.89);Difficulty in walking, not elsewhere classified (R26.2)     Time: 8958-8943 PT Time Calculation (min) (ACUTE ONLY): 15 min  Charges:    $Gait Training: 8-22 mins PT General Charges $$ ACUTE PT VISIT: 1 Visit                     Lenoard SQUIBB, PT Acute Rehabilitation Services Office: [phone]    Lenoard NOVAK Theran Vandergrift 06/09/2024, 11:53 AM

## 2024-06-09 NOTE — Progress Notes (Signed)
 No changes from my note/DC summary yesterday, DC home with hospice today  Sigurd Pac, MD

## 2024-06-09 NOTE — Plan of Care (Signed)
   Problem: Safety: Goal: Ability to remain free from injury will improve Outcome: Progressing

## 2024-06-09 NOTE — Progress Notes (Signed)
 OT Cancellation Note  Patient Details Name: Regina Bell MRN: [id number] DOB: 04/03/1941   Cancelled Treatment:    Reason Eval/Treat Not Completed: Patient at procedure or test/ unavailable (Echo) OT in room starting session and transport arriving. Pt leaving prior to pillbox testing for echo. OT to check back as appropriate and time allows.   Ely Molt 06/09/2024, 9:08 AM

## 2024-06-09 NOTE — Care Management Important Message (Signed)
 Important Message  Patient Details  Name: Regina Bell MRN: [id number] Date of Birth: [birth year]   Important Message Given:  Yes - Medicare IM     Vonzell Arrie Sharps 06/09/2024, 11:13 AM

## 2024-06-09 NOTE — Progress Notes (Signed)
 Discharge instruction given to daughter and son in law at bedside.   Family expressed verbal understanding of discharge instructions.  Pt seen by OT ambulating down the hallway with walker.  Two  PIV removed dressings intact and secured.  tele removed and placed in slot at nurse station CCMD/Alicia.  Pt wearing foley cath home.  Leg bag to be placed prior to discharge.    To be transferred to [facility]IN [facility].   RN to notify MD regarding antiemetic prescription.

## 2024-06-10 LAB — PROTEIN ELECTROPHORESIS, SERUM
A/G Ratio: 0.7 (ref 0.7–1.7)
Albumin ELP: 2.7 g/dL — ABNORMAL LOW (ref 2.9–4.4)
Alpha-1-Globulin: 0.5 g/dL — ABNORMAL HIGH (ref 0.0–0.4)
Alpha-2-Globulin: 1 g/dL (ref 0.4–1.0)
Beta Globulin: 1 g/dL (ref 0.7–1.3)
Gamma Globulin: 1.3 g/dL (ref 0.4–1.8)
Globulin, Total: 3.7 g/dL (ref 2.2–3.9)
M-Spike, %: 0.2 g/dL — ABNORMAL HIGH
Total Protein ELP: 6.4 g/dL (ref 6.0–8.5)

## 2024-06-12 ENCOUNTER — Other Ambulatory Visit (HOSPITAL_COMMUNITY): Payer: Self-pay | Admitting: Adult Health

## 2024-06-23 ENCOUNTER — Ambulatory Visit: Payer: Self-pay | Admitting: Adult Health

## 2024-07-01 ENCOUNTER — Ambulatory Visit (HOSPITAL_BASED_OUTPATIENT_CLINIC_OR_DEPARTMENT_OTHER): Admitting: Family

## 2024-07-04 DIAGNOSIS — E7800 Pure hypercholesterolemia, unspecified: Secondary | ICD-10-CM | POA: Insufficient documentation

## 2024-07-04 DIAGNOSIS — E559 Vitamin D deficiency, unspecified: Secondary | ICD-10-CM | POA: Insufficient documentation

## 2024-07-07 ENCOUNTER — Ambulatory Visit: Admitting: Family Medicine

## 2024-07-18 ENCOUNTER — Ambulatory Visit: Admitting: Internal Medicine

## 2024-09-08 DEATH — deceased
